# Patient Record
Sex: Male | Born: 1935 | Race: White | Hispanic: No | Smoking: Former smoker
Health system: Southern US, Community
[De-identification: ages and names within clinical notes are randomized; demographics above are authoritative.]

## PROBLEM LIST (undated history)

## (undated) DIAGNOSIS — E669 Obesity, unspecified: Secondary | ICD-10-CM

## (undated) DIAGNOSIS — I2510 Atherosclerotic heart disease of native coronary artery without angina pectoris: Secondary | ICD-10-CM

## (undated) DIAGNOSIS — I10 Essential (primary) hypertension: Secondary | ICD-10-CM

## (undated) DIAGNOSIS — E785 Hyperlipidemia, unspecified: Secondary | ICD-10-CM

## (undated) HISTORY — PX: OTHER SURGICAL HISTORY: SHX169

## (undated) HISTORY — PX: CHOLECYSTECTOMY: SHX55

## (undated) HISTORY — PX: CAROTID ARTERY ANGIOPLASTY: SHX1300

---

## 2012-03-21 ENCOUNTER — Ambulatory Visit: Payer: Self-pay | Admitting: Vascular Surgery

## 2012-04-17 ENCOUNTER — Ambulatory Visit: Payer: Self-pay | Admitting: Vascular Surgery

## 2012-04-17 LAB — CBC
MCH: 32.1 pg (ref 26.0–34.0)
MCHC: 34.1 g/dL (ref 32.0–36.0)
Platelet: 191 10*3/uL (ref 150–440)
RBC: 4.69 10*6/uL (ref 4.40–5.90)
WBC: 5.7 10*3/uL (ref 3.8–10.6)

## 2012-04-17 LAB — BASIC METABOLIC PANEL
Anion Gap: 9 (ref 7–16)
BUN: 16 mg/dL (ref 7–18)
Calcium, Total: 8.4 mg/dL — ABNORMAL LOW (ref 8.5–10.1)
Chloride: 108 mmol/L — ABNORMAL HIGH (ref 98–107)
Creatinine: 1 mg/dL (ref 0.60–1.30)
EGFR (African American): >60
EGFR (Non-African Amer.): >60
Glucose: 106 mg/dL — ABNORMAL HIGH (ref 65–99)
Osmolality: 287 (ref 275–301)
Potassium: 4.2 mmol/L (ref 3.5–5.1)
Sodium: 143 mmol/L (ref 136–145)

## 2012-04-24 ENCOUNTER — Inpatient Hospital Stay: Payer: Self-pay | Admitting: Vascular Surgery

## 2012-04-25 LAB — CBC WITH DIFFERENTIAL/PLATELET
Basophil #: 0 10*3/uL (ref 0.0–0.1)
Eosinophil #: 0 10*3/uL (ref 0.0–0.7)
HCT: 44.3 % (ref 40.0–52.0)
Lymphocyte %: 11.2 %
MCH: 32.2 pg (ref 26.0–34.0)
MCHC: 33.8 g/dL (ref 32.0–36.0)
MCV: 95 fL (ref 80–100)
Monocyte #: 0.6 x10 3/mm (ref 0.2–1.0)
Neutrophil %: 81.6 %
Platelet: 232 10*3/uL (ref 150–440)
RBC: 4.66 10*6/uL (ref 4.40–5.90)
RDW: 13.9 % (ref 11.5–14.5)
WBC: 8.4 10*3/uL (ref 3.8–10.6)

## 2012-04-25 LAB — PROTIME-INR: Prothrombin Time: 12.8 secs (ref 11.5–14.7)

## 2012-04-25 LAB — BASIC METABOLIC PANEL
Anion Gap: 8 (ref 7–16)
BUN: 11 mg/dL (ref 7–18)
Co2: 28 mmol/L (ref 21–32)
Creatinine: 0.85 mg/dL (ref 0.60–1.30)
EGFR (African American): >60
EGFR (Non-African Amer.): >60
Glucose: 108 mg/dL — ABNORMAL HIGH (ref 65–99)
Sodium: 140 mmol/L (ref 136–145)

## 2012-04-25 LAB — APTT: Activated PTT: 29 secs (ref 23.6–35.9)

## 2012-04-25 LAB — PATHOLOGY REPORT

## 2014-05-13 ENCOUNTER — Inpatient Hospital Stay: Payer: Self-pay | Admitting: Surgery

## 2014-05-13 LAB — URINALYSIS, COMPLETE
BACTERIA: NONE SEEN
BLOOD: NEGATIVE
Bilirubin,UR: NEGATIVE
GLUCOSE, UR: NEGATIVE mg/dL (ref 0–75)
Ketone: NEGATIVE
Leukocyte Esterase: NEGATIVE
NITRITE: NEGATIVE
Ph: 5 (ref 4.5–8.0)
RBC,UR: 1 /HPF (ref 0–5)
SPECIFIC GRAVITY: 1.027 (ref 1.003–1.030)
SQUAMOUS EPITHELIAL: NONE SEEN

## 2014-05-13 LAB — BASIC METABOLIC PANEL
ANION GAP: 6 — AB (ref 7–16)
BUN: 13 mg/dL (ref 7–18)
CHLORIDE: 104 mmol/L (ref 98–107)
CO2: 29 mmol/L (ref 21–32)
Calcium, Total: 8.3 mg/dL — ABNORMAL LOW (ref 8.5–10.1)
Creatinine: 0.97 mg/dL (ref 0.60–1.30)
EGFR (African American): >60
Glucose: 102 mg/dL — ABNORMAL HIGH (ref 65–99)
Osmolality: 278 (ref 275–301)
Potassium: 3.9 mmol/L (ref 3.5–5.1)
SODIUM: 139 mmol/L (ref 136–145)

## 2014-05-13 LAB — LIPASE, BLOOD: Lipase: 148 U/L (ref 73–393)

## 2014-05-13 LAB — CBC
HCT: 45.6 % (ref 40.0–52.0)
HGB: 14.9 g/dL (ref 13.0–18.0)
MCH: 32.2 pg (ref 26.0–34.0)
MCHC: 32.7 g/dL (ref 32.0–36.0)
MCV: 98 fL (ref 80–100)
Platelet: 207 10*3/uL (ref 150–440)
RBC: 4.64 10*6/uL (ref 4.40–5.90)
RDW: 12.4 % (ref 11.5–14.5)
WBC: 9.9 10*3/uL (ref 3.8–10.6)

## 2014-05-13 LAB — TROPONIN I: Troponin-I: <0.02 ng/mL

## 2014-05-14 LAB — COMPREHENSIVE METABOLIC PANEL
ALK PHOS: 148 U/L — AB
Albumin: 2.3 g/dL — ABNORMAL LOW (ref 3.4–5.0)
Anion Gap: 5 — ABNORMAL LOW (ref 7–16)
BILIRUBIN TOTAL: 1.1 mg/dL — AB (ref 0.2–1.0)
BUN: 12 mg/dL (ref 7–18)
CALCIUM: 7.7 mg/dL — AB (ref 8.5–10.1)
CO2: 30 mmol/L (ref 21–32)
Chloride: 101 mmol/L (ref 98–107)
Creatinine: 0.98 mg/dL (ref 0.60–1.30)
EGFR (African American): >60
EGFR (Non-African Amer.): >60
Glucose: 115 mg/dL — ABNORMAL HIGH (ref 65–99)
OSMOLALITY: 273 (ref 275–301)
Potassium: 4 mmol/L (ref 3.5–5.1)
SGOT(AST): 38 U/L — ABNORMAL HIGH (ref 15–37)
SGPT (ALT): 54 U/L
SODIUM: 136 mmol/L (ref 136–145)
TOTAL PROTEIN: 6.3 g/dL — AB (ref 6.4–8.2)

## 2014-05-14 LAB — CBC WITH DIFFERENTIAL/PLATELET
Basophil #: 0 10*3/uL (ref 0.0–0.1)
Basophil %: 0.2 %
EOS PCT: 0.5 %
Eosinophil #: 0 10*3/uL (ref 0.0–0.7)
HCT: 39.7 % — ABNORMAL LOW (ref 40.0–52.0)
HGB: 13.3 g/dL (ref 13.0–18.0)
LYMPHS ABS: 0.6 10*3/uL — AB (ref 1.0–3.6)
Lymphocyte %: 6.4 %
MCH: 32.9 pg (ref 26.0–34.0)
MCHC: 33.6 g/dL (ref 32.0–36.0)
MCV: 98 fL (ref 80–100)
Monocyte #: 0.9 x10 3/mm (ref 0.2–1.0)
Monocyte %: 10 %
NEUTROS PCT: 82.9 %
Neutrophil #: 7.3 10*3/uL — ABNORMAL HIGH (ref 1.4–6.5)
Platelet: 154 10*3/uL (ref 150–440)
RBC: 4.05 10*6/uL — ABNORMAL LOW (ref 4.40–5.90)
RDW: 12.3 % (ref 11.5–14.5)
WBC: 8.8 10*3/uL (ref 3.8–10.6)

## 2014-05-15 LAB — CBC WITH DIFFERENTIAL/PLATELET
BASOS ABS: 0 10*3/uL (ref 0.0–0.1)
Basophil %: 0.1 %
EOS ABS: 0 10*3/uL (ref 0.0–0.7)
Eosinophil %: 0.3 %
HCT: 37.7 % — AB (ref 40.0–52.0)
HGB: 12.8 g/dL — ABNORMAL LOW (ref 13.0–18.0)
Lymphocyte #: 0.5 10*3/uL — ABNORMAL LOW (ref 1.0–3.6)
Lymphocyte %: 7.2 %
MCH: 33.1 pg (ref 26.0–34.0)
MCHC: 34 g/dL (ref 32.0–36.0)
MCV: 97 fL (ref 80–100)
MONOS PCT: 11.3 %
Monocyte #: 0.8 x10 3/mm (ref 0.2–1.0)
Neutrophil #: 5.7 10*3/uL (ref 1.4–6.5)
Neutrophil %: 81.1 %
Platelet: 145 10*3/uL — ABNORMAL LOW (ref 150–440)
RBC: 3.88 10*6/uL — ABNORMAL LOW (ref 4.40–5.90)
RDW: 12.6 % (ref 11.5–14.5)
WBC: 7 10*3/uL (ref 3.8–10.6)

## 2014-05-15 LAB — BASIC METABOLIC PANEL
Anion Gap: 6 — ABNORMAL LOW (ref 7–16)
BUN: 15 mg/dL (ref 7–18)
CHLORIDE: 100 mmol/L (ref 98–107)
CO2: 29 mmol/L (ref 21–32)
CREATININE: 1.14 mg/dL (ref 0.60–1.30)
Calcium, Total: 7.7 mg/dL — ABNORMAL LOW (ref 8.5–10.1)
EGFR (African American): >60
EGFR (Non-African Amer.): >60
GLUCOSE: 114 mg/dL — AB (ref 65–99)
OSMOLALITY: 272 (ref 275–301)
POTASSIUM: 4.2 mmol/L (ref 3.5–5.1)
Sodium: 135 mmol/L — ABNORMAL LOW (ref 136–145)

## 2014-05-15 LAB — HEPATIC FUNCTION PANEL A (ARMC)
ALT: 37 U/L
AST: 24 U/L (ref 15–37)
Albumin: 2 g/dL — ABNORMAL LOW (ref 3.4–5.0)
Alkaline Phosphatase: 125 U/L — ABNORMAL HIGH
BILIRUBIN DIRECT: 0.4 mg/dL — AB (ref 0.00–0.20)
Bilirubin,Total: 0.8 mg/dL (ref 0.2–1.0)
Total Protein: 6 g/dL — ABNORMAL LOW (ref 6.4–8.2)

## 2014-05-16 LAB — CBC WITH DIFFERENTIAL/PLATELET
BASOS ABS: 0 10*3/uL (ref 0.0–0.1)
Basophil %: 0.2 %
Eosinophil #: 0.2 10*3/uL (ref 0.0–0.7)
Eosinophil %: 2.4 %
HCT: 35.5 % — AB (ref 40.0–52.0)
HGB: 11.9 g/dL — AB (ref 13.0–18.0)
LYMPHS PCT: 10.8 %
Lymphocyte #: 0.8 10*3/uL — ABNORMAL LOW (ref 1.0–3.6)
MCH: 32.7 pg (ref 26.0–34.0)
MCHC: 33.6 g/dL (ref 32.0–36.0)
MCV: 97 fL (ref 80–100)
MONO ABS: 0.9 x10 3/mm (ref 0.2–1.0)
Monocyte %: 12.9 %
NEUTROS PCT: 73.7 %
Neutrophil #: 5.2 10*3/uL (ref 1.4–6.5)
PLATELETS: 166 10*3/uL (ref 150–440)
RBC: 3.64 10*6/uL — AB (ref 4.40–5.90)
RDW: 12.6 % (ref 11.5–14.5)
WBC: 7.1 10*3/uL (ref 3.8–10.6)

## 2014-05-16 LAB — BASIC METABOLIC PANEL
ANION GAP: 5 — AB (ref 7–16)
BUN: 10 mg/dL (ref 7–18)
CALCIUM: 8 mg/dL — AB (ref 8.5–10.1)
Chloride: 103 mmol/L (ref 98–107)
Co2: 30 mmol/L (ref 21–32)
Creatinine: 0.9 mg/dL (ref 0.60–1.30)
EGFR (African American): >60
EGFR (Non-African Amer.): >60
Glucose: 106 mg/dL — ABNORMAL HIGH (ref 65–99)
Osmolality: 275 (ref 275–301)
Potassium: 4.2 mmol/L (ref 3.5–5.1)
SODIUM: 138 mmol/L (ref 136–145)

## 2014-05-17 LAB — CBC WITH DIFFERENTIAL/PLATELET
BASOS ABS: 0 10*3/uL (ref 0.0–0.1)
Basophil %: 0.5 %
Eosinophil #: 0.3 10*3/uL (ref 0.0–0.7)
Eosinophil %: 3.2 %
HCT: 37.8 % — ABNORMAL LOW (ref 40.0–52.0)
HGB: 12.7 g/dL — ABNORMAL LOW (ref 13.0–18.0)
LYMPHS PCT: 9.4 %
Lymphocyte #: 0.8 10*3/uL — ABNORMAL LOW (ref 1.0–3.6)
MCH: 32.6 pg (ref 26.0–34.0)
MCHC: 33.7 g/dL (ref 32.0–36.0)
MCV: 97 fL (ref 80–100)
Monocyte #: 1.1 x10 3/mm — ABNORMAL HIGH (ref 0.2–1.0)
Monocyte %: 13.3 %
NEUTROS ABS: 6 10*3/uL (ref 1.4–6.5)
NEUTROS PCT: 73.6 %
Platelet: 203 10*3/uL (ref 150–440)
RBC: 3.91 10*6/uL — ABNORMAL LOW (ref 4.40–5.90)
RDW: 12.3 % (ref 11.5–14.5)
WBC: 8.2 10*3/uL (ref 3.8–10.6)

## 2014-05-17 LAB — BASIC METABOLIC PANEL
Anion Gap: 7 (ref 7–16)
BUN: 7 mg/dL (ref 7–18)
CALCIUM: 7.9 mg/dL — AB (ref 8.5–10.1)
Chloride: 102 mmol/L (ref 98–107)
Co2: 32 mmol/L (ref 21–32)
Creatinine: 0.94 mg/dL (ref 0.60–1.30)
EGFR (African American): >60
Glucose: 118 mg/dL — ABNORMAL HIGH (ref 65–99)
Osmolality: 280 (ref 275–301)
POTASSIUM: 4.1 mmol/L (ref 3.5–5.1)
Sodium: 141 mmol/L (ref 136–145)

## 2014-05-17 LAB — COMPREHENSIVE METABOLIC PANEL
AST: 21 U/L (ref 15–37)
Albumin: 1.9 g/dL — ABNORMAL LOW (ref 3.4–5.0)
Alkaline Phosphatase: 114 U/L
Bilirubin,Total: 0.3 mg/dL (ref 0.2–1.0)
SGPT (ALT): 24 U/L
Total Protein: 6 g/dL — ABNORMAL LOW (ref 6.4–8.2)

## 2014-05-18 LAB — PATHOLOGY REPORT

## 2014-11-17 NOTE — Op Note (Signed)
PATIENT NAME:  Aaron Wilkinson, Aaron Wilkinson MR#:  [id number] DATE OF BIRTH:  08-29-1929  DATE OF PROCEDURE:  04/24/2012  PREOPERATIVE DIAGNOSES:  1. High-grade right carotid artery stenosis.  2. Mild to moderate left carotid artery stenosis.  3. Hypertension.  4. Hyperlipidemia.  5. Coronary disease.   POSTOPERATIVE DIAGNOSES: 1. High-grade right carotid artery stenosis.  2. Mild to moderate left carotid artery stenosis.  3. Hypertension.  4. Hyperlipidemia.  5. Coronary disease.   PROCEDURE: Right carotid endarterectomy with CorMatrix patch arterial repair.   SURGEON: Annice Needy, MD   ANESTHESIA: General.   ESTIMATED BLOOD LOSS: Approximately 50 mL.   INDICATION FOR PROCEDURE: This is an 81 year old male with high-grade right carotid artery stenosis in the 90% range by CT angiography. His left carotid artery stenosis had a mild to moderate blockage. For stroke risk reduction reasons, endarterectomy was discussed and he desired to proceed.   DESCRIPTION OF PROCEDURE: The patient is brought to the operative suite and after an adequate level of general anesthesia was obtained he was placed in the modified beach chair position. His neck was flexed and turned to the side. A roll was placed under his shoulders to help facilitate our exposure. He was sterilely prepped and draped and a sterile surgical field was created. An incision was created along the anterior border of the sternocleidomastoid. We dissected through the platysma with electrocautery. I then retracted the sternocleidomastoid laterally. There were some small venous branches that were ligated and divided between silk ties as was the facial vein. This exposed the carotid bifurcation. The artery was small to medium. This was dissected out and the common carotid artery, external carotid artery, internal carotid artery, and superior thyroid artery were all encircled with vessel loops. 6000 units of intravenous heparin was given for systemic  anticoagulation and control was pulled up on the vessel loops. Anterior arteriotomy was made with an 11 blade and extended with Potts scissors. The Pruitt-Inahara shunt was then placed first in the internal carotid artery, flushed and de-aired, and then the common carotid artery flushed and de-aired, and then flow was restored. Approximate time from clamp to flow restoration was two minutes. At this point endarterectomy was performed in the typical fashion with the elevator. The proximal endpoint was cut flush with tenotomy scissors. An eversion endarterectomy was performed in the external carotid artery and a nice feathered distal endpoint was created with gentle traction in the internal carotid artery. The distal endpoint was tacked down with two 7-0 Prolene sutures. The wound was locally heparinized. Due to the small size of the artery, a patch closure was necessary. A CorMatrix extracellular Matrix patch was selected for the arterial repair. This was cut and beveled and a 6-0 Prolene suture was started at the distal endpoint and run approximately one-half the length of the arteriotomy medially and laterally. The patch was cut and beveled to an appropriate length proximally to match the arteriotomy and a second 6-0 Prolene was started at the proximal endpoint. The medial suture lines were tied together and the lateral suture lines were run until the shunt needed to be removed. The shunt was then removed, the vessel was flushed, and the external, internal, and common carotid arteries were then locally heparinized and then the arteriotomy was completed flushing through the external carotid artery prior to release of control. Four minutes elapsed from shunt removal until flow restoration to the internal carotid artery. There were a couple of areas that required 6-0 Prolene patch sutures and  hemostasis was achieved. The wound was then irrigated. Evicyl was placed and the wound was closed with three interrupted 3-0  Vicryl, the platysma was closed with a running 3-0 Vicryl, and the skin was closed with 4-0 Monocryl. Sterile dressing was placed. The patient tolerated the procedure well and was taken to the recovery room in stable condition.   ____________________________ Annice NeedyJason S. Dew, MD jsd:drc D: 04/24/2012 13:22:36 ET T: 04/24/2012 13:40:04 ET JOB#: [id number]  cc: Annice NeedyJason S. Dew, MD, <Dictator> Marya AmslerMarshall W. [name], MD Annice NeedyJASON S DEW MD ELECTRONICALLY SIGNED 04/25/2012 13:44

## 2014-11-21 NOTE — Op Note (Signed)
PATIENT NAME:  Aaron Wilkinson, Aaron Wilkinson MR#:  [id number] DATE OF BIRTH:  [birth year]  DATE OF PROCEDURE:  05/14/2014  PREOPERATIVE DIAGNOSIS: Acute cholecystitis.   POSTOPERATIVE DIAGNOSIS: Acute gangrenous cholecystitis with empyema.  PROCEDURE: Laparoscopic cholecystectomy.   SURGEON: Dr. Dionne Miloichard Christpher Stogsdill.   ANESTHESIA: General with endotracheal tube.   ASSISTANT: Docia ChuckKirk Williams, PA-S  INDICATIONS: This is a patient with right upper quadrant pain and a workup showing acute cholecystitis. Preoperatively, we discussed the rationale for surgery, the options of observation, risk of bleeding, infection, recurrence of symptoms, failure to resolve his symptoms, open procedure, bile duct damage, bile duct leak, bowel injury, retained common bile duct stone, any of which could require further surgery and/or ERCP, stent, and papillotomy. This was all reviewed for him and his family. They understood and agreed to proceed.   FINDINGS: Acute gangrenous cholecystitis with empyema, necrotic gallbladder with fragmentation, small stones.   Of note, the back wall of the gallbladder, which was necrotic was left in place and fulgurated.   DESCRIPTION OF PROCEDURE: The patient was induced to general anesthesia. He was on IV Levaquin, was redosed during the case. VTE prophylaxis was in place. He was prepped and draped in a sterile fashion. Marcaine was infiltrated in skin and subcutaneous tissues around the periumbilical area. An incision was made. Veress needle was placed. Pneumoperitoneum was obtained and a 5-mm trocar port was placed. The abdominal cavity was explored and under direct vision, a 10-mm epigastric port and 2 lateral 5-mm ports were placed. The gallbladder was found to be encased in omentum and was taken down bluntly from the fundus down revealing a greatly inflamed gallbladder which essentially fell apart as it was being dissected revealing small black gallstones and frank purulence and necrosis of the  gallbladder wall. A portion of the gallbladder was placed on tension. The abdominal viscera, either the transverse colon or the duodenum, was greatly adherent to the infundibular area of the gallbladder. This was taken down with blunt dissection very carefully to avoid injury to the bowel. None was identified.  This may have signified an impending or early choledochocolonic or choledochoduodenal fistula although no connection was identified at the time of the operation.   Further dissection revealed the cystic duct which was dissected out and doubly clipped and divided. The cystic artery was identified and doubly clipped and divided, but it was thrombosed and did not back bleed. The gallbladder was taken in pieces from the gallbladder back wall which was necrotic as well and could not be easily dissected off of the gallbladder fossa. The portions of gallbladder and some of the stones were retrieved with an Endo Catch bag. The area was irrigated with copious amounts of normal saline. Hemostasis was with electrocautery. With the back wall still being in place and being necrotic, it was fulgurated with electrocautery and checked for hemostasis multiple times.  From the lateral port site was placed a 10-mm JP drain, placed into the foramen of Winslow tied in with 3-0 nylon. Again, further inspection demonstrated minimal oozing in the area of the cystic artery, which was believed to be thrombosed, but there was no arterial bleeding. Therefore, 2 pieces of Surgicel were placed in this area. No further bleeding was noted. It was inspected over time and irrigation was performed. Again no further bleeding noted.  (To aid in this view, the 30-degree 5-mm scope was utilized as well.)   The patient tolerated this procedure well. There were no complications. Considering the extent of necrosis and frank  purulence within the gallbladder, the laparoscopic approach was attained and he was taken to the recovery room in  stable condition to be admitted for continued care. After closing his wounds with 0 Vicryl figure-of-8 sutures at the epigastric site and staples at the skin edges. The drain was placed to bulb suction and sterile dressings were placed.   The patient tolerated the procedure well.    ____________________________ Adah Salvage. Excell Seltzer, MD rec:lt D: 05/14/2014 13:55:04 ET T: 05/14/2014 14:26:16 ET JOB#: [id number]  cc: Adah Salvage. Excell Seltzer, MD, <Dictator> Lattie Haw MD ELECTRONICALLY SIGNED 05/14/2014 17:37

## 2014-11-21 NOTE — Consult Note (Signed)
PATIENT NAME:  Aaron Wilkinson, Aaron Wilkinson MR#:  [id number] DATE OF BIRTH:  1930-06-28  DATE OF CONSULTATION:  05/13/2014  REFERRING PHYSICIAN:  Adah Salvageichard E. Excell Seltzerooper, MD.  CARDIOLOGIST: Darlin PriestlyKenneth A. [name], MD.  PRIMARY CARE PHYSICIAN:  Marcina MillardAlexander Graclynn Vanantwerp, MD.  CHIEF COMPLAINT: Abdominal pain.   HISTORY OF PRESENT ILLNESS: The patient is an 81 year old gentleman who presents with abdominal pain, seen by Dr. Excell Seltzerooper and found to have acute cholecystitis. The patient has known coronary artery disease, status post CABG. He was then lost to follow-up for 2 years. The patient denies prior history of myocardial infarction, congestive heart failure, chronic kidney disease, diabetes or stroke. He denies chest pain or shortness of breath.   PAST MEDICAL HISTORY: 1. Coronary artery bypass graft surgery in 1994.  2. Hyperlipidemia.  3. Status post right carotid endarterectomy.   MEDICATIONS: Alprazolam 25 mg b.i.d.   SOCIAL HISTORY: The patient currently lives alone. The patient has a remote tobacco abuse history.   FAMILY HISTORY: No immediate family history of coronary artery disease or myocardial infarction.   REVIEW OF SYSTEMS:  CONSTITUTIONAL: No fever or chills.  EYES: No blurry vision.  EARS: No hearing loss.  RESPIRATORY: No shortness of breath.  CARDIOVASCULAR: No chest pain.  GASTROINTESTINAL: The patient had abdominal pain as described above.  GENITOURINARY: No dysuria or hematuria.  ENDOCRINE: No polyuria or polydipsia.  MUSCULOSKELETAL: No arthralgias or myalgias.  NEUROLOGIC: No focal muscle weakness or numbness.  PSYCHOLOGICAL: No depression or anxiety.   PHYSICAL EXAMINATION: VITAL SIGNS: Blood pressure 124/73, pulse 87, respirations 20, temperature 99, pulse oximetry 95%.  HEENT: Pupils equal, reactive to light and accommodation.  NECK: Supple without thyromegaly.  LUNGS: Clear.  HEART: Normal JVP. Normal PMI. Regular rate and rhythm. Normal S1, S2. No appreciable gallop, murmur or  rub.  ABDOMEN: Soft and nontender. Pulses were intact bilaterally.  MUSCULOSKELETAL: Normal muscle tone.  NEUROLOGIC: The patient is alert and oriented x 3. Motor and sensory both grossly intact.   IMPRESSION: An 81 year old gentleman with known coronary artery disease, status post coronary artery bypass graft, currently without chest pain. The patient presents with abdominal pain with acute cholecystitis awaiting laparoscopic cholecystectomy. The patient has had no prior history of myocardial infarction, congestive heart failure, stroke, diabetes or chronic kidney disease. EKG is unremarkable. The patient would be at low risk for serious cardiovascular complication during laparoscopic cholecystectomy.   RECOMMENDATIONS: 1. Agree with overall current therapy.  2. Restart low-dose beta blocker for cardioprotection.  3. Proceed with surgery as planned without further cardiac diagnostics at this time.   ____________________________ Marcina MillardAlexander Katanya Schlie, MD ap:TT D: 05/13/2014 16:55:39 ET T: 05/13/2014 18:10:25 ET JOB#: [id number]  cc: Marcina MillardAlexander Salaya Holtrop, MD, <Dictator> Marcina MillardALEXANDER Panagiotis Oelkers MD ELECTRONICALLY SIGNED 05/16/2014 14:13

## 2014-11-21 NOTE — Discharge Summary (Signed)
PATIENT NAME:  Aaron Wilkinson, Aaron Wilkinson MR#:  [id number] DATE OF BIRTH:  [birth year]  DATE OF ADMISSION:  05/13/2014 DATE OF DISCHARGE:  05/17/2014  DIAGNOSES:  1. Acute gangrenous cholecystitis with empyema of the gallbladder.  2. Coronary artery disease.  3. Peripheral vascular disease.  4. Stroke.   PROCEDURES: Laparoscopic cholecystectomy.   CONSULTANTS: Dr. Einar CrowMarshall Anderson, internal medicine and Dr. [name], cardiology.   HISTORY OF PRESENT ILLNESS AND HOSPITAL COURSE: This is a patient who presents with several days of abdominal pain. He stated it was 5 days. In retrospect, his son states he has been ill for several months. The patient is minimizing his symptoms, but he also is noncompliant with his medications and physician follow up. In fact, he had coronary artery bypass graft in 1994. He did not see a cardiologist until 2 years ago, when he was required to do so for preoperative clearance for a carotid endarterectomy performed by Dr. [name]. Since that time 2 years ago, he has not seen a cardiologist and has not taken any of his medications. No anticholesterol drugs and no presumed beta blocker because his medications make him feel strange. He has been noncompliant with his followup and medications.   The patient was taken to the operating room for acute cholecystitis, at which time it was identified that he had gangrenous cholecystitis with empyema of the gallbladder. A drain was placed. Postoperatively, the drain showed no sign of biliary fistula or purulence. Therefore, the drain was removed once he was tolerating a regular diet. He is currently doing well and has been reminded, both he and his family, of the need for compliance with his medication recommendations and prescriptions as well as follow up with both Dr. [name] either Dr. [name] or Dr. [name]. Dr. [name] is who he had seen previously.   The patient understands, as does his family, the importance of this follow up and the  medications. The family is skeptical that he will maintain compliance with these, however. The patient is discharged on a regular diet with staples in place with instructions to shower tomorrow. His medications are reconciled. His only new medication then would be the oral analgesic, but he is also on an anticholesterol medication and metoprolol.     ____________________________ Adah Salvageichard E. Excell Seltzerooper, MD rec:TT D: 05/17/2014 08:30:49 ET T: 05/17/2014 18:15:40 ET JOB#: [id number]  cc: Adah Salvageichard E. Excell Seltzerooper, MD, <Dictator> Lattie HawICHARD E Hymen Arnett MD ELECTRONICALLY SIGNED 05/17/2014 18:59

## 2014-11-21 NOTE — H&P (Signed)
PATIENT NAME:  Aaron Wilkinson, Aaron Wilkinson MR#:  [id number] DATE OF BIRTH:  [birth year]  DATE OF ADMISSION:  05/13/2014   CHIEF COMPLAINT: Epigastric pain.   HISTORY OF PRESENT ILLNESS: This is a patient with 5 days of abdominal pain. It has not been related to any kind of meal, but on Friday he noted abdominal pain. He points to the epigastrium. He states that it has moved to the right side and right upper quadrant, as well as the right lower quadrant and right back. He has not had an episode like this before. He denies fevers or chills. He has had some nausea, multiple emeses on Friday or Saturday, and then again this morning. He has not eaten since yesterday. He denies jaundice or acholic stools. He has had some loose stools and occasional diarrhea over the weekend.   PAST MEDICAL HISTORY: Coronary artery disease, coronary artery bypass graft several years ago, right carotid endarterectomy. Of note, the patient has not seen his cardiologist in 2 years, when he saw a cardiologist, Dr. Lady Gary, for cardiac clearance for his carotid endarterectomy. 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 His CABG was in the 1990s, and he states he has not seen a cardiologist between his bypass and Dr. Driscilla Grammes need for cardiac clearance. He also states that he is essentially noncompliant, in that he does not take his anticholesterol medication nor his presumed beta blocker because it makes him feel bad. He is not taking any medications at this time, except occasional Xanax.   PAST SURGICAL HISTORY: Coronary artery bypass graft and left inguinal hernia repair.   ALLERGIES: PENICILLIN AND SHELLFISH.   MEDICATIONS: Xanax.   FAMILY HISTORY: Noncontributory.   SOCIAL HISTORY: The patient used to smoke tobacco; he stopped many years ago, with approximately a 5 pack-year history. He drinks alcohol daily, he states 2 beers with 1 [employer]  (whiskey). He lives alone, but he is accompanied by his son and daughter. He is a retired Biomedical scientist.   REVIEW OF SYSTEMS: A 10 system review was performed and negative, with the exception of that mentioned in the HPI and documented in the chart.   PHYSICAL EXAMINATION:  GENERAL: Healthy, comfortable-appearing male patient. He is afebrile at 97.6, pulse of 82, respirations 16, blood pressure 121/68. Pain scale 3. Room air saturation 98%.  HEENT: No scleral icterus.  NECK: No palpable neck nodes.  CHEST: Clear to auscultation.  CARDIAC: Regular rate and rhythm.  ABDOMEN: Showing some guarding in the right upper quadrant, tenderness in the epigastrium and right upper quadrant with a positive Murphy sign.  EXTREMITIES: Without edema.  NEUROLOGIC: Grossly intact. INTEGUMENT: No jaundice. There is a midline sternotomy scar with drain scars, all well healed.  DIAGNOSTIC DATA: CT scan and ultrasound are reviewed, suggestive of acute appendicitis. Bile duct is 4 mm. Stones are noted.   No liver function tests were drawn in the Emergency Room, and are not currently available.   Creatinine is 0.97; electrolytes are otherwise within normal limits.   White blood cell count of 9.9, hemoglobin and hematocrit of 14.9 and 45.6 with a platelet count of 207,000.   ASSESSMENT AND PLAN: This is a patient with acute cholecystitis. He is relatively noncompliant with his medications for coronary artery disease and prior carotid endarterectomy. He takes no anticoagulation. He takes no beta blocker or anticholesterol drugs; although they apparently have been prescribed, he  refuses to take them because it makes him feel bad. He has not seen a cardiologist in 2 years, and then only because he needed clearance for his carotid endarterectomy. There was a question of whether or not he has ocular stroke prior to his carotid endarterectomy.   With the diagnosis of acute cholecystitis, he will require a laparoscopic  cholecystectomy at some point. However, I would like to have him be seen by Dr. [name] if possible, and by Dr. Einar CrowMarshall Anderson, his internist, for optimization of any medications at this point, and then proceeding to laparoscopic cholecystectomy. I will place consults.   I have discussed this patient with Dr. Rochel BromeBecky Lord in the Emergency Room, and with his family.     ____________________________ Adah Salvageichard E. Excell Seltzerooper, MD rec:MT D: 05/13/2014 15:19:06 ET T: 05/13/2014 15:42:05 ET JOB#: [id number]  cc: Adah Salvageichard E. Excell Seltzerooper, MD, <Dictator> Lattie HawICHARD E Shatha Hooser MD ELECTRONICALLY SIGNED 05/14/2014 10:41

## 2014-11-21 NOTE — Consult Note (Signed)
PATIENT NAME:  Aaron Wilkinson, CRUCES MR#:  [id number] DATE OF BIRTH:  [birth year]  DATE OF CONSULTATION:  05/13/2014  PRIMARY CARE PHYSICIAN: Marya Amsler. Dareen Piano, MD.   REQUESTING PHYSICIAN: Adah Salvage. Excell Seltzer, MD.   REASON FOR CONSULTATION: Hypertension, CAD and perioperative management in a patient with cholecystitis.   HISTORY OF PRESENTING ILLNESS: An 81 year old, Caucasian male patient presented to the emergency room complaining of epigastric abdominal pain along with nausea and vomiting. The patient has been found to have acute cholecystitis on CT scan followed by an ultrasound. Presently, plan is for a cholecystectomy tomorrow. The patient mentions that he has had similar issues in the past which tend to resolve with some Aleve, ibuprofen or BC powder, but this time they were much more severe. He does not have any vomiting at this point. Does have nausea. Has some mild pain in the periumbilical epigastric area and right upper quadrant area. He does not complain of any chest pain, shortness of breath, syncope, dizziness. He has not been taking all his home medications. The patient was supposed to be on blood pressure and cholesterol medications, but has not been taking them as these cause nausea.   PAST MEDICAL HISTORY:  1. CAD with CABG 20 years back.  2. Hypertension.  3. Hyperlipidemia.  4. Obesity.  5. Right carotid stenosis status post endarterectomy in 2013.  6. History of esophagitis and hiatal hernia.   SOCIAL HISTORY: The patient does not smoke. Rare alcohol use. No illicit drug use. 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   CODE STATUS: Full code.   FAMILY HISTORY: Hypertension.   ALLERGIES: PENICILLIN AND SHELLFISH.   HOME MEDICATIONS: Are alprazolam 0.25 mg oral daily as needed, which he takes rarely.   REVIEW OF SYSTEMS:  CONSTITUTIONAL:  Complains of some fatigue.  EYES: No blurred vision, pain or redness.  ENT: No tinnitus, ear pain, hearing loss.   RESPIRATORY: No cough, wheeze, hemoptysis.  CARDIOVASCULAR: No chest pain, orthopnea, edema.  GASTROINTESTINAL: Has nausea. Vomiting has resolved. Also has abdominal pain, but no diarrhea.  GENITOURINARY: No dysuria, hematuria, frequency.  ENDOCRINE: No polyuria, nocturia, thyroid problems.  HEMATOLOGIC AND LYMPHATIC: No anemia, easy bruising, bleeding. INTEGUMENTARY: No acne, rash, lesion.  MUSCULOSKELETAL: No back pain or arthralgias. NEUROLOGIC: No focal numbness, weakness, seizure.  PSYCHIATRIC: No anxiety or depression.   PHYSICAL EXAMINATION: VITAL SIGNS: Temperature of 99, pulse 87, respirations 20, blood pressure 124/73, saturating 95% on room air.  GENERAL: Obese, Caucasian, male patient lying in bed. Overall seems comfortable, conversational, cooperative with exam.  PSYCHIATRIC: Alert and oriented x 3. Mood and affect appropriate. Judgment intact.  HEENT: Atraumatic, normocephalic. Oral mucosa dry and pink. External ears and nose normal. No pallor. No icterus. Pupils bilaterally equal and reactive to light.  NECK: Supple. No thyromegaly. No palpable lymph nodes. Trachea midline. No JVD.  CARDIOVASCULAR: S1, S2 without any murmurs. Peripheral pulses 2+. No edema.  RESPIRATORY: Normal work of breathing. Clear to auscultation on both sides.  GASTROINTESTINAL: Soft abdomen. Tenderness in the epigastric and right upper quadrant area on deep palpation. Murphy's sign negative.  GENITOURINARY: No CVA tenderness or bladder distention.  SKIN: Warm and dry. No petechiae, rash, ulcers.  MUSCULOSKELETAL: No joint swelling, redness, effusion of the large joints. Normal muscle tone.  NEUROLOGICAL: Motor strength 5/5 in upper and lower extremities. Sensation is intact all over.  LYMPHATICS: No cervical lymphadenopathy.   LABORATORY DATA: WBC 9.9, hemoglobin 14.9, platelets of 207,000. Urinalysis shows no bacteria. Troponin less  than 0.02. Glucose 102. BUN 13, creatinine 0.97, sodium 139,  potassium 3.9, GFR 160.   DIAGNOSTIC DATA:  1. EKG shows normal sinus rhythm with nonspecific changes.  2. Chest x-ray shows pulmonary hyperinflation, nothing acute.  3. CT scan of the abdomen and pelvis with contrast shows acute cholecystitis with gallbladder wall thickening, extensive inflammatory change around the gallbladder and diverticulosis.  4. Ultrasound of right upper quadrant confirmed acute cholecystitis.   ASSESSMENT AND PLAN: 1. Coronary artery disease. The patient does not have any chest pain, is asymptomatic at this point and seems to have good functional status. The patient has been started on beta blocker by Dr. [name] at 50 mg which will be continued. I have explained to the patient that this is a small dose and should not cause any side effects. We will monitor. The patient should also be taking an aspirin after discharge daily.  2. The patient should be low risk for his cholecystectomy procedure.  3. Acute cholecystitis. Continue antibiotics. Surgery has been scheduled tomorrow. Continue intravenous fluids.  4. Hyperlipidemia. We will start the patient on a small dose of Lipitor.  5. Deep vein thrombosis prophylaxis. The patient is on heparin.   TIME SPENT: Today on this consult was 35 minutes.     ____________________________ Molinda BailiffSrikar R. Arty Lantzy, MD srs:TT D: 05/13/2014 17:53:34 ET T: 05/13/2014 18:55:12 ET JOB#: [id number]  cc: Wardell HeathSrikar R. Jaelle Campanile, MD, <Dictator> Orie FishermanSRIKAR R Brynja Marker MD ELECTRONICALLY SIGNED 05/27/2014 10:45

## 2014-11-21 NOTE — Consult Note (Signed)
Brief Consult Note: Diagnosis: POC, known CAD s/p CABG, no CP, probable low risk for serious CV complication during lap chole.   Patient was seen by consultant.   Consult note dictated.   Comments: 1.  Start metoprolol succinate 50 mg QD 2.  Defer cardiac daiagnostics 3.  Proceed with lap chole.  Electronic Signatures: Marcina MillardParaschos, Demetrus Pavao (MD)  (Signed 14-Oct-15 16:57)  Authored: Brief Consult Note   Last Updated: 14-Oct-15 16:57 by Marcina MillardParaschos, Novelle Addair (MD)

## 2014-11-21 NOTE — H&P (Signed)
Subjective/Chief Complaint epig pain   History of Present Illness five days worsening epig and RUQ pain nausea, mult emesis no f/c no jaundice no ach stools, occ diarrhea   Past History CAD/CABG, rt CEA has not seen cardiologist in 2 years, does not take his prescribed anti cholest meds or presumed beta blocker. Had seen Dr Ubaldo Glassing 2013 for cardiac clearance for Dr Bunnie Domino CE[facility]. Hx CVA? ocular TIA?  [facility] CABG, [facility]   Past Medical Health Coronary Artery Disease   Past Med/Surgical Hx:  Chronic Shortness of Breath:   HOH:   CAD:   Hernia Surgery:   CABG:   ALLERGIES:  Penicillin: Swelling  Shellfish: Hives  Family and Social History:  Family History Non-Contributory   Social History positive  tobacco, positive ETOH, retired Midwife; stopped tobacco many years ago   + Tobacco Prior (greater than 1 year)   Place of Living Home   Review of Systems:  Fever/Chills No   Cough No   Abdominal Pain Yes   Diarrhea Yes   Constipation No   Nausea/Vomiting Yes   SOB/DOE No   Chest Pain No   Dysuria No   Tolerating Diet No  Nauseated  Vomiting   Medications/Allergies Reviewed Medications/Allergies reviewed   Physical Exam:  GEN no acute distress   HEENT pink conjunctivae   NECK supple   RESP normal resp effort  clear BS  no use of accessory muscles   CARD regular rate   ABD positive tenderness  guarding RUQ, pos Murphy's sign   LYMPH rt CEA scar   EXTR negative edema   SKIN normal to palpation   PSYCH alert, A+O to time, place, person, poor insight, minimizes condition, noncompliant   Lab Results: Routine Chem:  14-Oct-15 03:09   Lipase 148 (Result(s) reported on 13 May 2014 at 08:09AM.)  Glucose, Serum  102  BUN 13  Creatinine (comp) 0.97  Sodium, Serum 139  Potassium, Serum 3.9  Chloride, Serum 104  CO2, Serum 29  Calcium (Total), Serum  8.3  Anion Gap  6  Osmolality (calc) 278  eGFR (African American) >60  eGFR (Non-African American)  >60 (eGFR values <33m/min/1.73 m2 may be an indication of chronic kidney disease (CKD). Calculated eGFR, using the MRDR Study equation, is useful in  patients with stable renal function. The eGFR calculation will not be reliable in acutely ill patients when serum creatinine is changing rapidly. It is not useful in patients on dialysis. The eGFR calculation may not be applicable to patients at the low and high extremes of body sizes, pregnant women, and vetetarians.)  Cardiac:  14-Oct-15 03:09   Troponin I < 0.02 (0.00-0.05 0.05 ng/mL or less: NEGATIVE  Repeat testing in 3-6 hrs  if clinically indicated. >0.05 ng/mL: POTENTIAL  MYOCARDIAL INJURY. Repeat  testing in 3-6 hrs if  clinically indicated. NOTE: An increase or decrease  of 30% or more on serial  testing suggests a  clinically important change)  Routine UA:  14-Oct-15 05:41   Color (UA) Amber  Clarity (UA) Clear  Glucose (UA) Negative  Bilirubin (UA) Negative  Ketones (UA) Negative  Specific Gravity (UA) 1.027  Blood (UA) Negative  pH (UA) 5.0  Protein (UA) 30 mg/dL  Nitrite (UA) Negative  Leukocyte Esterase (UA) Negative (Result(s) reported on 13 May 2014 at 07:11AM.)  RBC (UA) 1 /HPF  WBC (UA) 1 /HPF  Bacteria (UA) NONE SEEN  Epithelial Cells (UA) NONE SEEN  Mucous (UA) PRESENT (Result(s) reported on 13 May 2014  at 07:11AM.)  Routine Hem:  14-Oct-15 03:09   WBC (CBC) 9.9  RBC (CBC) 4.64  Hemoglobin (CBC) 14.9  Hematocrit (CBC) 45.6  Platelet Count (CBC) 207 (Result(s) reported on 13 May 2014 at 03:24AM.)  MCV 98  MCH 32.2  MCHC 32.7  RDW 12.4   Radiology Results: XRay:    14-Oct-15 03:00, Chest PA and Lateral  Chest PA and Lateral  REASON FOR EXAM:    CP  COMMENTS:       PROCEDURE: DXR - DXR CHEST PA (OR AP) AND LATERAL  - May 13 2014  3:00AM     CLINICAL DATA:  Right-sided chest pain since Friday. Initial  encounter.    EXAM:  CHEST  2 VIEW    COMPARISON:  None.    FINDINGS:  No  cardiomegaly. Aortic tortuosity. Status post CABG. Pulmonary  hyperinflation. There is no edema, consolidation, effusion, or  pneumothorax.    Subtle, smudgy density overlapping the anterior left sixth rib is  not convincing for pulmonary nodule orsclerotic bone focus. No  acute osseous findings.     IMPRESSION:  1. No acute findings.  2. Pulmonary hyperinflation.      Electronically Signed    By: Jorje Guild M.D.    On: 05/13/2014 03:53     Verified By: Gilford Silvius, M.D.,  [place]:    14-Oct-15 09:00, US Abdomen Limited Survey  US Abdomen Limited Survey  REASON FOR EXAM:    ABD PAIN  COMMENTS:   Body Site: Gallbladder, Liver, Common Bile Duct    PROCEDURE: [place]  - US ABDOMEN LIMITED SURVEY  - May 13 2014  9:00AM     CLINICAL DATA:  Abdominal pain.  Abnormal CT scan this same day.    EXAM:  US ABDOMEN LIMITED - RIGHT UPPER QUADRANT    COMPARISON:  CT abdomen and pelvis 05/03/2014 at 6:48 a.m.    FINDINGS:  Gallbladder:  Multiple small stones are identified in the gallbladder measuring up  to 0.7 cm in diameter. Gallbladder sludge is also seen. Thereare  areas of gallbladder wall thickening of up to 0.5 cm. The  sonographer was unable to evaluate for Murphy's sign as the patient  has received pain medication.    Common bile duct:    Diameter: 0.4 cm.    Liver:    0.7 cm simple cyst in left lobe is noted. Within normal limits in  parenchymal echogenicity.   IMPRESSION:  When correlated with CT scan today, findings are most consistent  with acute cholecystitis.      Electronically Signed    By: Inge Rise M.D.    On: 05/13/2014 09:21         Verified By: Ramond Dial, M.D.,  CT:    14-Oct-15 06:52, CT Abdomen and Pelvis With Contrast  CT Abdomen and Pelvis With Contrast  REASON FOR EXAM:    (1) generalized abdominal pain and vomiting; (2)   generalized abdominal pain and  COMMENTS:       PROCEDURE: CT  - CT ABDOMEN / PELVIS  W  - May 13 2014  6:52AM     CLINICAL DATA:  Abdominal pain and vomiting. Midsternal chest pain  radiating to the back. Symptoms since 05/08/2014. Initial encounter.    EXAM:  CT ABDOMEN AND PELVIS WITH CONTRAST    TECHNIQUE:  Multidetector CT imaging of the abdomen and pelvis was performed  using the standard protocol following bolus administration of  intravenous contrast.  CONTRAST:  100 cc Isovue 300.    COMPARISON:  None.    FINDINGS:  The lung bases are clear.  No pleural or pericardial effusion.    The gallbladder is dilated with small stones present. The  gallbladder wall is thickened and there is stranding about the  gallbladder. The biliary tree is unremarkable.    The liver, spleen, adrenal glands, pancreas and right kidney appear  normal. A small left renal cyst is incidentally noted. Aortoiliac  atherosclerosis without aneurysm is seen. The prostate gland is  mildly enlarged.    Diverticulosis is most notable in the sigmoid colon but there is no  evidence of diverticulitis. The colon is otherwise unremarkable. The  stomach, small bowel and appendix appear normal. Nofocal fluid  collection is identified. There is no lymphadenopathy. No lytic or  sclerotic bony lesion is identified.     IMPRESSION:  Findings consistent with acute cholecystitis with gallbladder wall  thickening, dilatation and small stones seen. Extensive inflammatory  change is present about the gallbladder.    Aortoiliac atherosclerosis without aneurysm.  Mild prostatomegaly.    Diverticulosis without diverticulitis.      Electronically Signed    By: Inge Rise M.D.    On: 05/13/2014 07:20         Verified By: Ramond Dial, M.D.,    Assessment/Admission Diagnosis acute chole admit hydrate, IV abx lap chole when able, check LFT preop (not drawn in ED) IM nad crdiology consult for preop clearance in noncompliant pt son and daughter present agrees with plan   Electronic  Signatures: Florene Glen (MD)  (Signed 14-Oct-15 12:15)  Authored: CHIEF COMPLAINT and HISTORY, PAST MEDICAL/SURGIAL HISTORY, ALLERGIES, FAMILY AND SOCIAL HISTORY, REVIEW OF SYSTEMS, PHYSICAL EXAM, LABS, Radiology, ASSESSMENT AND PLAN   Last Updated: 14-Oct-15 12:15 by Florene Glen (MD)

## 2015-03-11 ENCOUNTER — Emergency Department: Payer: Medicare Other

## 2015-03-11 ENCOUNTER — Other Ambulatory Visit: Payer: Self-pay

## 2015-03-11 ENCOUNTER — Emergency Department
Admission: EM | Admit: 2015-03-11 | Discharge: 2015-03-11 | Disposition: A | Discharge status: Home / Self Care | Payer: Medicare Other | Attending: Emergency Medicine | Admitting: Emergency Medicine

## 2015-03-11 DIAGNOSIS — I10 Essential (primary) hypertension: Secondary | ICD-10-CM | POA: Diagnosis not present

## 2015-03-11 DIAGNOSIS — Z880 Allergy status to penicillin: Secondary | ICD-10-CM | POA: Diagnosis not present

## 2015-03-11 DIAGNOSIS — Z87891 Personal history of nicotine dependence: Secondary | ICD-10-CM | POA: Diagnosis not present

## 2015-03-11 DIAGNOSIS — R000 Tachycardia, unspecified: Secondary | ICD-10-CM | POA: Diagnosis not present

## 2015-03-11 DIAGNOSIS — N179 Acute kidney failure, unspecified: Secondary | ICD-10-CM | POA: Insufficient documentation

## 2015-03-11 DIAGNOSIS — R197 Diarrhea, unspecified: Secondary | ICD-10-CM | POA: Insufficient documentation

## 2015-03-11 DIAGNOSIS — E869 Volume depletion, unspecified: Secondary | ICD-10-CM | POA: Insufficient documentation

## 2015-03-11 DIAGNOSIS — R51 Headache: Secondary | ICD-10-CM | POA: Diagnosis not present

## 2015-03-11 DIAGNOSIS — R112 Nausea with vomiting, unspecified: Secondary | ICD-10-CM | POA: Diagnosis present

## 2015-03-11 DIAGNOSIS — K9289 Other specified diseases of the digestive system: Secondary | ICD-10-CM | POA: Insufficient documentation

## 2015-03-11 DIAGNOSIS — R109 Unspecified abdominal pain: Secondary | ICD-10-CM | POA: Insufficient documentation

## 2015-03-11 DIAGNOSIS — N289 Disorder of kidney and ureter, unspecified: Secondary | ICD-10-CM

## 2015-03-11 HISTORY — DX: Hyperlipidemia, unspecified: E78.5

## 2015-03-11 HISTORY — DX: Obesity, unspecified: E66.9

## 2015-03-11 HISTORY — DX: Essential (primary) hypertension: I10

## 2015-03-11 HISTORY — DX: Atherosclerotic heart disease of native coronary artery without angina pectoris: I25.10

## 2015-03-11 LAB — BASIC METABOLIC PANEL
Anion gap: 12 (ref 5–15)
BUN: 30 mg/dL — ABNORMAL HIGH (ref 6–20)
CO2: 24 mmol/L (ref 22–32)
Calcium: 8.3 mg/dL — ABNORMAL LOW (ref 8.9–10.3)
Chloride: 100 mmol/L — ABNORMAL LOW (ref 101–111)
Creatinine, Ser: 1.62 mg/dL — ABNORMAL HIGH (ref 0.61–1.24)
GFR calc Af Amer: 44 mL/min — ABNORMAL LOW (ref >60)
GFR calc non Af Amer: 38 mL/min — ABNORMAL LOW (ref >60)
GLUCOSE: 122 mg/dL — AB (ref 65–99)
POTASSIUM: 4.7 mmol/L (ref 3.5–5.1)
SODIUM: 136 mmol/L (ref 135–145)

## 2015-03-11 LAB — CBC
HEMATOCRIT: 49.3 % (ref 40.0–52.0)
Hemoglobin: 17.1 g/dL (ref 13.0–18.0)
MCH: 32.1 pg (ref 26.0–34.0)
MCHC: 34.6 g/dL (ref 32.0–36.0)
MCV: 92.8 fL (ref 80.0–100.0)
Platelets: 181 10*3/uL (ref 150–440)
RBC: 5.32 MIL/uL (ref 4.40–5.90)
RDW: 13 % (ref 11.5–14.5)
WBC: 5.3 10*3/uL (ref 3.8–10.6)

## 2015-03-11 LAB — COMPREHENSIVE METABOLIC PANEL
ALBUMIN: 4.2 g/dL (ref 3.5–5.0)
ALT: 35 U/L (ref 17–63)
ANION GAP: 13 (ref 5–15)
AST: 42 U/L — ABNORMAL HIGH (ref 15–41)
Alkaline Phosphatase: 71 U/L (ref 38–126)
BUN: 30 mg/dL — AB (ref 6–20)
CO2: 20 mmol/L — ABNORMAL LOW (ref 22–32)
Calcium: 9.3 mg/dL (ref 8.9–10.3)
Chloride: 103 mmol/L (ref 101–111)
Creatinine, Ser: 1.73 mg/dL — ABNORMAL HIGH (ref 0.61–1.24)
GFR calc Af Amer: 41 mL/min — ABNORMAL LOW (ref >60)
GFR calc non Af Amer: 35 mL/min — ABNORMAL LOW (ref >60)
Glucose, Bld: 140 mg/dL — ABNORMAL HIGH (ref 65–99)
Potassium: 3.7 mmol/L (ref 3.5–5.1)
SODIUM: 136 mmol/L (ref 135–145)
TOTAL PROTEIN: 8.4 g/dL — AB (ref 6.5–8.1)
Total Bilirubin: 0.7 mg/dL (ref 0.3–1.2)

## 2015-03-11 LAB — LIPASE, BLOOD: LIPASE: 19 U/L — AB (ref 22–51)

## 2015-03-11 MED ORDER — ONDANSETRON HCL 4 MG PO TABS: ORAL_TABLET | ORAL | Status: AC

## 2015-03-11 MED ORDER — IOHEXOL 240 MG/ML SOLN
50.0000 mL | INTRAMUSCULAR | Status: AC
  Administered 2015-03-11: 50 mL via ORAL

## 2015-03-11 MED ORDER — SODIUM CHLORIDE 0.9 % IV BOLUS (SEPSIS)
1000.0000 mL | Freq: Once | INTRAVENOUS | Status: AC
  Administered 2015-03-11: 1000 mL via INTRAVENOUS

## 2015-03-11 MED ORDER — ONDANSETRON HCL 4 MG/2ML IJ SOLN: 4.0000 mg | Freq: Once | INTRAMUSCULAR | Status: DC | PRN

## 2015-03-11 MED ORDER — ONDANSETRON HCL 4 MG/2ML IJ SOLN
4.0000 mg | INTRAMUSCULAR | Status: AC
  Administered 2015-03-11: 4 mg via INTRAVENOUS
  Filled 2015-03-11: qty 2

## 2015-03-11 NOTE — Discharge Instructions (Signed)
We believe your symptoms are caused by a viral infection.  Since your symptoms have improved, we feel it is safe for you to go home and follow up with your regular doctor.  However, you are dehydrated enough that your kidneys did suffer, and it is very important that you follow-up as soon as possible with Dr. Dareen Piano.  Please read the included information and stick to a bland diet for the next two days.  Drink plenty of clear fluids, and if you were provided with a prescription, please take it according to the label instructions.    If you develop any new or worsening symptoms, including persistent vomiting not controlled with medication, fever greater than 101, severe or worsening abdominal pain, or other symptoms that concern you, please return immediately to the Emergency Department.

## 2015-03-11 NOTE — ED Provider Notes (Signed)
[facility] Emergency Department Provider Note  ____________________________________________  Time seen: Approximately 3:34 PM  I have reviewed the triage vital signs and the nursing notes.   HISTORY  Chief Complaint Nausea; Emesis; and Diarrhea    HPI Aaron Wilkinson is a 81 y.o. male with a past medical history of coronary artery disease status post CABG, hypertension, hyperlipidemia, but is in generally good health for his age who presents with approximately 6 days of nausea, vomiting, and diarrhea.  The episodes began with diarrhea 6 days ago and persisted several days every time he tried to eat or drink anything.  Several days ago the patient also began having nausea and vomiting, primarily after anytime that he ate.  He has not observed any blood in either his emesis or his stool.  He tried taking Pepto-Bismol but believes that it made him feel worse.  He has no abdominal pain other than what he describes as some muscle soreness which he believes is due to his frequent episodes of diarrhea and vomiting.  He denies chest pain, shortness of breath, fever/chills.  Overall he describes symptoms as severe but he feels better now than he did before.  His last episode of vomiting was approximately 30 minutes prior to arrival in the emergency department.   Past Medical History  Diagnosis Date  . Coronary artery disease     s/p CABG  . Hypertension   . Hyperlipidemia   . Obesity     There are no active problems to display for this patient.   Past Surgical History  Procedure Laterality Date  . Carotid artery angioplasty    . Cholecystectomy    . Cardiac bipass      No current outpatient prescriptions on file.  Allergies Penicillins  History reviewed. No pertinent family history.  Social History Social History  Substance Use Topics  . Smoking status: Former Research scientist (life sciences)  . Smokeless tobacco: Never Used  . Alcohol Use: Yes    Review of  Systems Constitutional: No fever/chills Eyes: No visual changes. ENT: No sore throat. Cardiovascular: Denies chest pain. Respiratory: Denies shortness of breath. Gastrointestinal: Mild abdominal "soreness".  Persistent nausea/vomiting for several days, persistent diarrhea for almost 6 days.. Genitourinary: Negative for dysuria. Musculoskeletal: Negative for back pain. Skin: Negative for rash. Neurological: Mild headache, no focal weakness or numbness.  10-point ROS otherwise negative.  ____________________________________________   PHYSICAL EXAM:  VITAL SIGNS: ED Triage Vitals  Enc Vitals Group     BP 03/11/15 1244 124/83 mmHg     Pulse Rate 03/11/15 1244 93     Resp 03/11/15 1244 17     Temp 03/11/15 1244 97.4 F (36.3 C)     Temp Source 03/11/15 1244 Oral     SpO2 03/11/15 1244 97 %     Weight 03/11/15 1244 185 lb (83.915 kg)     Height 03/11/15 1244 5' 9"  (1.753 m)     Head Cir --      Peak Flow --      Pain Score 03/11/15 1246 2     Pain Loc --      Pain Edu? --      Excl. in [place]? --     Constitutional: Alert and oriented. Well appearing and in no acute distress.  Appears generally than neurological age. Eyes: Conjunctivae are normal. PERRL. EOMI. Head: Atraumatic. Nose: No congestion/rhinnorhea. Mouth/Throat: Mucous membranes are moist.  Oropharynx non-erythematous. Neck: No stridor.   Cardiovascular: Borderline tachycardia with rates in the  90s, regular rhythm. Grossly normal heart sounds.  Good peripheral circulation. Respiratory: Normal respiratory effort.  No retractions. Lungs CTAB. Gastrointestinal: Soft and nontender to deep palpation. No distention. No abdominal bruits. No CVA tenderness. Musculoskeletal: No lower extremity tenderness nor edema.  No joint effusions. Neurologic:  Normal speech and language. No gross focal neurologic deficits are appreciated.  Skin:  Skin is warm, dry and intact. No rash noted. Psychiatric: Mood and affect are normal.  Speech and behavior are normal.  The patient is asking when he can go home.  ____________________________________________   LABS (all labs ordered are listed, but only abnormal results are displayed)  Labs Reviewed  LIPASE, BLOOD - Abnormal; Notable for the following:    Lipase 19 (*)    All other components within normal limits  COMPREHENSIVE METABOLIC PANEL - Abnormal; Notable for the following:    CO2 20 (*)    Glucose, Bld 140 (*)    BUN 30 (*)    Creatinine, Ser 1.73 (*)    Total Protein 8.4 (*)    AST 42 (*)    GFR calc non Af Amer 35 (*)    GFR calc Af Amer 41 (*)    All other components within normal limits  CBC  URINALYSIS COMPLETEWITH MICROSCOPIC ([facility] ONLY)  BASIC METABOLIC PANEL   ____________________________________________  EKG  ED ECG REPORT I, Neal Trulson, the attending physician, personally viewed and interpreted this ECG.  Date: 03/11/2015 EKG Time: 16:13 Rate: 69 Rhythm: normal sinus rhythm QRS Axis: normal Intervals: normal ST/T Wave abnormalities: Non-specific ST segment / T-wave changes, but no evidence of acute ischemia. Conduction Disutrbances: none Narrative Interpretation: Consistent with prior EKG from 05/13/2014 with no evidence of acute ischemia  ____________________________________________  RADIOLOGY  Ct Abdomen Pelvis Wo Contrast  03/11/2015   CLINICAL DATA:  Nausea, vomiting, and diarrhea. Acute renal failure.  EXAM: CT ABDOMEN AND PELVIS WITHOUT CONTRAST  TECHNIQUE: Multidetector CT imaging of the abdomen and pelvis was performed following the standard protocol without IV contrast.  COMPARISON:  05/13/2014  FINDINGS: No consolidation or effusion in the lung bases.  The gallbladder is surgically absent. No biliary dilatation is seen. No focal liver lesion is identified. The spleen, adrenal glands, and pancreas have an unremarkable unenhanced appearance. The kidneys have an unremarkable unenhanced appearance without hydronephrosis.   Oral contrast partially visualized in the distal esophagus may reflect esophageal dysmotility or reflux. A diverticulum is again seen involving the third portion of the duodenum, slightly less distended than on the prior study. Oral contrast is present in multiple nondilated loops of small bowel. There is no evidence of bowel obstruction. Sigmoid and descending colon diverticulosis is again noted without evidence of diverticulitis. Liquid stool in the colon is consistent with provided history of diarrheal illness. The appendix is likely identified in the right lower quadrant as a collapsed tubular soft tissue density, without evidence of appendicitis.  Bladder is largely decompressed. Moderate aortoiliac atherosclerotic calcification is noted. Prior left inguinal hernia repair is again noted. No free fluid or enlarged lymph nodes are seen. Mild prostatic enlargement is unchanged. Small L5 superior endplate Schmorl's node is unchanged.  IMPRESSION: 1. Colonic diverticulosis without evidence of diverticulitis. 2. No bowel obstruction.   Electronically Signed   By: Logan Bores   On: 03/11/2015 17:49    ____________________________________________   PROCEDURES  Procedure(s) performed: None  Critical Care performed: No ____________________________________________   INITIAL IMPRESSION / ASSESSMENT AND PLAN / ED COURSE  Pertinent labs & imaging results  that were available during my care of the patient were reviewed by me and considered in my medical decision making (see chart for details).  In spite of the patient's age and history, he is quite well-appearing and in no acute distress at this time.  He is almost finished with a 1 L normal saline fluid bolus and states that the artery feels better.  He has not vomited since coming to the emergency Department and receiving a dose of Zofran.  He has no abdominal tenderness to palpation, which is reassuring.    He does, however, have acute renal  insufficiency with an elevated BUN and creatinine.  This is understandable given his recent symptoms.  The patient very much wants to go home, but given his age, duration of symptoms, and evidence of end organ dysfunction, I will further evaluate with a CT scan of his abdomen and pelvis with by mouth contrast only given his reduced GFR.  This will also serve as a by mouth challenge.  After the completion of his 1 L normal saline bolus I will repeat a basic metabolic panel to determine if his GFR is improving.  He may be appropriate for discharge if all these conditions are met.  I discussed this with the patient and his daughter and they both understand and agree with the plan.  I am also obtaining an EKG to compare against his prior, although with no history of chest pain or shortness of breath, I will not obtain a troponin at this time.  ----------------------------------------- 7:27 PM on 03/11/2015 -----------------------------------------  The patient improved significantly after his IV fluids.  He had a by mouth challenge with no issues.  I had another extensive discussion with him and his family and he very much wants to go home.  Given his improvement in the repeat BMP and his ability to tolerate by mouth fluids, I agreed to discharge him if he follows up as soon as possible with his primary care doctor.  I gave him extensive instructions about drinking plenty of by mouth fluids and advancing his diet slowly.  His CT scan was reassuring with no evidence of colitis so I did not order any antibiotics.  The patient is very comfortable with this plan and has a family member to stay with him tonight.  I gave my usual and customary return precautions. ____________________________________________  FINAL CLINICAL IMPRESSION(S) / ED DIAGNOSES  Final diagnoses:  Non-intractable vomiting with nausea, vomiting of unspecified type  Diarrhea  Volume depletion, gastrointestinal loss  Acute renal  insufficiency      NEW MEDICATIONS STARTED DURING THIS VISIT:  New Prescriptions   No medications on file     Hinda Kehr, MD 03/12/15 [phone]

## 2015-03-11 NOTE — ED Notes (Signed)
Pt c/o diarrhea starting Friday night, then starting having N/V Sunday and has continued til now.Aaron Wilkinson

## 2016-10-24 ENCOUNTER — Encounter (INDEPENDENT_AMBULATORY_CARE_PROVIDER_SITE_OTHER): Payer: Self-pay

## 2016-10-24 ENCOUNTER — Ambulatory Visit (INDEPENDENT_AMBULATORY_CARE_PROVIDER_SITE_OTHER): Payer: Self-pay | Admitting: Vascular Surgery

## 2016-11-23 ENCOUNTER — Other Ambulatory Visit (INDEPENDENT_AMBULATORY_CARE_PROVIDER_SITE_OTHER): Payer: Self-pay | Admitting: Vascular Surgery

## 2016-11-23 DIAGNOSIS — I6523 Occlusion and stenosis of bilateral carotid arteries: Secondary | ICD-10-CM

## 2016-11-24 ENCOUNTER — Ambulatory Visit (INDEPENDENT_AMBULATORY_CARE_PROVIDER_SITE_OTHER): Payer: Medicare Other | Admitting: Vascular Surgery

## 2016-11-24 ENCOUNTER — Ambulatory Visit (INDEPENDENT_AMBULATORY_CARE_PROVIDER_SITE_OTHER): Payer: Medicare Other

## 2016-11-24 ENCOUNTER — Encounter (INDEPENDENT_AMBULATORY_CARE_PROVIDER_SITE_OTHER): Payer: Self-pay | Admitting: Vascular Surgery

## 2016-11-24 VITALS — BP 167/87 | HR 60 | Resp 16 | Ht 69.0 in | Wt 180.0 lb

## 2016-11-24 DIAGNOSIS — I10 Essential (primary) hypertension: Secondary | ICD-10-CM | POA: Diagnosis not present

## 2016-11-24 DIAGNOSIS — I6529 Occlusion and stenosis of unspecified carotid artery: Secondary | ICD-10-CM | POA: Insufficient documentation

## 2016-11-24 DIAGNOSIS — I6523 Occlusion and stenosis of bilateral carotid arteries: Secondary | ICD-10-CM

## 2016-11-24 NOTE — Patient Instructions (Addendum)
Atherosclerosis Atherosclerosis is narrowing and hardening of the blood vessels (arteries). Arteries are tubes that carry blood that contains oxygen from the heart to all parts of the body. Arteries can become narrow or clogged with a buildup of fat, cholesterol, calcium, or other substances (plaque). Plaque decreases the amount of blood that can flow through the artery. Atherosclerosis can affect any artery in the body, including:  Heart arteries (coronary artery disease), which may cause heart attack.  Brain arteries, which may cause stroke.  Leg, arm, and pelvis arteries (peripheral artery disease), which may cause pain and numbness.  Kidney arteries, which may cause kidney (renal) failure. Treatment may slow the disease and prevent further damage to the heart, brain, peripheral arteries, and kidneys. What are the causes? Atherosclerosis develops when plaque forms in an artery. This damages the inside wall of the artery. Over time, the plaque grows and hardens. It may break through the artery wall. This causes a blood clot to form over the break, which narrows the artery more. The clot may also break loose and travel to other arteries, causing more damage. What increases the risk? This condition is more likely to develop in people who:  Are middle age or older.  Have a family history of atherosclerosis.  Have high cholesterol.  Have high blood fats (triglycerides).  Have diabetes.  Are overweight.  Smoke tobacco.  Do not exercise enough.  Have a substance in the blood that indicates increased levels of inflammation in the body (C-reactive protein, or CRP).  Have sleep apnea.  Are stressed.  Drink too much alcohol. What are the signs or symptoms? This condition may not cause any symptoms. If you do have symptoms, they are caused by damage to an area of your body that is not getting enough blood. The following symptoms are possible:  Coronary artery disease may cause chest  pain and shortness of breath.  Decreased blood supply to your brain may cause a stroke. Signs and symptoms of stroke may include sudden:  Weakness on one side of the body.  Confusion.  Changes in vision.  Inability to speak or understand speech.  Loss of balance, coordination, or ability to walk.  Severe headache.  Loss of consciousness.  Peripheral artery disease may cause pain and numbness, often in the legs and hips.  Renal failure may cause fatigue, nausea, swelling, and itchy skin. How is this diagnosed? This condition is diagnosed based on your medical history and a physical exam. During the exam, your health care provider will check your pulses and listen for a "whooshing" sound over your arteries (bruit). You may have tests, such as:  Blood tests to check your levels of cholesterol, triglycerides, and CRP.  Electrocardiogram (ECG) to check for heart damage.  Chest X-ray to see if your heart is enlarged, which is a sign of heart failure.  Stress test to see how your heart reacts to exercise.  Echocardiogram to get images of the inside of your heart.  Ankle-Brachial index to compare blood pressure in your arms to blood pressure in your ankles.  Ultrasound of your peripheral arteries to check blood flow.  CT scan to check for damage to your heart or brain.  X-rays of blood vessels after dye has been injected (angiogram) to check blood flow. How is this treated? Treatment starts with lifestyle changes, which may include:  Changing your diet.  Losing weight.  Reducing stress.  Exercising and being more physically active.  Not smoking. You also may need medicine   to:  Lower triglycerides and cholesterol.  Lower and control blood pressure.  Prevent blood clots.  Lower inflammation in your body.  Lower and control your blood sugar. Sometimes, surgery is needed to remove plaque, widen your artery, or create a new path for your blood (bypass). Surgical  treatment may include:  Removing plaque from an artery (endarterectomy).  Opening a narrowed heart artery (angioplasty).  Heart or peripheral artery bypass graft surgery. Follow these instructions at home: Lifestyle    Eat a heart-healthy diet. Talk to your health care provider or a diet specialist (dietitian) if you need help. A heart-healthy diet includes:  Limiting unhealthy fats and increasing healthy fats. Some examples of healthy fats are olive oil and canola oil.  Eating plant-based foods, such as fruits, vegetables, nuts, legumes, and whole grains.  Follow an exercise program as told by your health care provider.  Maintain a healthy weight. Lose weight if directed by your health care provider.  Rest when you are tired.  Learn to manage your stress.  Do not use any tobacco products, such as cigarettes, chewing tobacco, and e-cigarettes. If you need help quitting, ask your health care provider.  Limit alcohol intake to no more than 1 drink a day for nonpregnant women and 2 drinks a day for men. One drink equals 12 oz of beer, 5 oz of wine, or 1 oz of hard liquor.  Do not abuse drugs. General instructions   Take over-the-counter and prescription medicines only as told by your health care provider.  Manage other health conditions as told by your health care provider.  Keep all follow-up visits as told by your health care provider. This is important. Contact a health care provider if:  You have chest pain or discomfort. This includes squeezing chest pain that may feel like indigestion (angina).  You have shortness of breath.  You have an irregular heartbeat.  You have unexplained fatigue.  You have unexplained pain or numbness in an arm, leg, or hip.  You have nausea, swelling of your hands or feet, and itchy skin. Get help right away if:  You have symptoms of a heart attack, such as:  Chest pain.  Shortness of breath.  Pain in your neck, jaw, arms, back,  or stomach.  Cold sweat.  Nausea.  Light-headedness.  You have symptoms of a stroke, such as sudden:  Weakness on one side of your body.  Confusion.  Changes in vision.  Inability to speak or understand speech.  Loss of balance, coordination, or ability to walk.  Severe headache.  Loss of consciousness. These symptoms may represent a serious problem that is an emergency. Do not wait to see if the symptoms will go away. Get medical help right away. Call your local emergency services (911 in the [place] Do not drive yourself to the hospital. This information is not intended to replace advice given to you by your health care provider. Make sure you discuss any questions you have with your health care provider. Document Released: 10/07/2003 Document Revised: 12/23/2015 Document Reviewed: 06/07/2015 Elsevier Interactive Patient Education  2017 [employer]  

## 2016-11-24 NOTE — Assessment & Plan Note (Addendum)
His carotid duplex today reveals a widely patent right carotid endarterectomy with no recurrent stenosis. He has stable 40-59% stenosis in the left internal carotid artery. When compared to his study from last year, there has been no significant change. Will continue to follow this on an annual basis. Continue aspirin and statin agent.

## 2016-11-24 NOTE — Assessment & Plan Note (Signed)
blood pressure control important in reducing the progression of atherosclerotic disease. On appropriate oral medications.  

## 2016-11-24 NOTE — Progress Notes (Signed)
MRN : [id number]  Aaron Wilkinson is a 83 y.o. ([birth year]) male who presents with chief complaint of  Chief Complaint  Patient presents with  . Carotid    1 year ultrasound follow up  .  History of Present Illness: Patient returns in follow-up of carotid disease. He is doing well today without specific complaints. He is almost 5 years status post right carotid endarterectomy for high-grade stenosis. He denies any focal neurologic symptoms. Specifically, the patient denies amaurosis fugax, speech or swallowing difficulties, or arm or leg weakness or numbness. His carotid duplex today reveals a widely patent right carotid endarterectomy with no recurrent stenosis. He has stable 40-59% stenosis in the left internal carotid artery. When compared to his study from last year, there has been no significant change.  Current Outpatient Prescriptions  Medication Sig Dispense Refill  . ALPRAZolam (XANAX) 0.25 MG tablet Take 0.125-0.25 mg by mouth 2 (two) times daily as needed for anxiety.     Marland Kitchen amLODipine (NORVASC) 5 MG tablet Take by mouth.    Marland Kitchen aspirin EC 81 MG tablet Take by mouth.    Marland Kitchen atorvastatin (LIPITOR) 10 MG tablet Take 10 mg by mouth at bedtime.    . ondansetron (ZOFRAN) 4 MG tablet Take 1-2 tabs by mouth every 8 hours as needed for nausea/vomiting 30 tablet 0   No current facility-administered medications for this visit.     Past Medical History:  Diagnosis Date  . Coronary artery disease    s/p CABG  . Hyperlipidemia   . Hypertension   . Obesity     Past Surgical History:  Procedure Laterality Date  . cardiac bipass    . CAROTID ARTERY ANGIOPLASTY    . CHOLECYSTECTOMY      Social History Social History  Substance Use Topics  . Smoking status: Former Games developer  . Smokeless tobacco: Never Used  . Alcohol use Yes     Family History No bleeding or clotting disorders  Allergies  Allergen Reactions  . Penicillins Anaphylaxis  . Atorvastatin Diarrhea  .  Fenofibrate     Other reaction(s): Muscle Pain  . Lovastatin     Other reaction(s): Muscle Pain  . Pravastatin     Other reaction(s): Muscle Pain  . Statins Other (See Comments)    Reaction:  Muscle pain      REVIEW OF SYSTEMS (Negative unless checked)  Constitutional: Weight loss  Fever  Chills Cardiac: Chest pain   Chest pressure   Palpitations   Shortness of breath when laying flat   Shortness of breath at rest   Shortness of breath with exertion. Vascular:  Pain in legs with walking   Pain in legs at rest   Pain in legs when laying flat   Claudication   Pain in feet when walking  Pain in feet at rest  Pain in feet when laying flat   History of DVT   Phlebitis   Swelling in legs   Varicose veins   Non-healing ulcers Pulmonary:   Uses home oxygen   Productive cough   Hemoptysis   Wheeze  COPD   Asthma Neurologic:  Dizziness  Blackouts   Seizures   History of stroke   History of TIA  Aphasia   Temporary blindness   Dysphagia   Weakness or numbness in arms   Weakness or numbness in legs Musculoskeletal:  Arthritis   Joint swelling   Joint pain   Low back pain Hematologic:  Easy bruising  Easy  bleeding   Hypercoagulable state   Anemic  Hepatitis Gastrointestinal:  Blood in stool   Vomiting blood  Gastroesophageal reflux/heartburn   Difficulty swallowing. Genitourinary:  Chronic kidney disease   Difficult urination  Frequent urination  Burning with urination   Blood in urine Skin:  Rashes   Ulcers   Wounds Psychological:  History of anxiety    History of major depression.  Physical Examination  Vitals:   11/24/16 1106 11/24/16 1107  BP: (!) 162/95 (!) 167/87  Pulse: 60   Resp: 16   Weight: 180 lb (81.6 kg)   Height:  (1.753 m)    Body mass index is 26.58 kg/m. Gen:  WD/WN, NAD. Appears younger than stated age Head: [place]/AT, No temporalis  wasting. Ear/Nose/Throat: Hearing grossly intact, nares w/o erythema or drainage, trachea midline Eyes: Conjunctiva clear. Sclera non-icteric Neck: Supple.  No bruit or JVD.  Pulmonary:  Good air movement, equal and clear to auscultation bilaterally.  Cardiac: RRR, normal S1, S2, no Murmurs, rubs or gallops. Vascular:  Vessel Right Left  Radial Palpable Palpable                                   Gastrointestinal: soft, non-tender/non-distended. No guarding/reflex.  Musculoskeletal: M/S 5/5 throughout.  No deformity or atrophy.  Neurologic: CN 2-12 intact. Sensation grossly intact in extremities.  Symmetrical.  Speech is fluent. Motor exam as listed above. Psychiatric: Judgment intact, Mood & affect appropriate for pt's clinical situation. Dermatologic: No rashes or ulcers noted.  No cellulitis or open wounds.      CBC Lab Results  Component Value Date   WBC 5.3 03/11/2015   HGB 17.1 03/11/2015   HCT 49.3 03/11/2015   MCV 92.8 03/11/2015   PLT 181 03/11/2015    BMET    Component Value Date/Time   NA 136 03/11/2015 1626   NA 141 05/17/2014 0512   K 4.7 03/11/2015 1626   K 4.1 05/17/2014 0512   CL 100 (L) 03/11/2015 1626   CL 102 05/17/2014 0512   CO2 24 03/11/2015 1626   CO2 32 05/17/2014 0512   GLUCOSE 122 (H) 03/11/2015 1626   GLUCOSE 118 (H) 05/17/2014 0512   BUN 30 (H) 03/11/2015 1626   BUN 7 05/17/2014 0512   CREATININE 1.62 (H) 03/11/2015 1626   CREATININE 0.94 05/17/2014 0512   CALCIUM 8.3 (L) 03/11/2015 1626   CALCIUM 7.9 (L) 05/17/2014 0512   GFRNONAA 38 (L) 03/11/2015 1626   GFRNONAA >60 05/17/2014 0512   GFRNONAA >60 04/25/2012 0557   GFRAA 44 (L) 03/11/2015 1626   GFRAA >60 05/17/2014 0512   GFRAA >60 04/25/2012 0557   CrCl cannot be calculated (Patient's most recent lab result is older than the maximum 21 days allowed.).  COAG Lab Results  Component Value Date   INR 0.9 04/25/2012    Radiology No results  found.   Assessment/Plan Essential hypertension, benign blood pressure control important in reducing the progression of atherosclerotic disease. On appropriate oral medications.   Carotid stenosis His carotid duplex today reveals a widely patent right carotid endarterectomy with no recurrent stenosis. He has stable 40-59% stenosis in the left internal carotid artery. When compared to his study from last year, there has been no significant change. Will continue to follow this on an annual basis. Continue aspirin and statin agent.    Festus Barren, MD  11/24/2016 1:09 PM    This note  was created with Dragon medical transcription system.  Any errors from dictation are purely unintentional

## 2016-12-17 IMAGING — CT CT ABD-PELV W/O CM
2 of 4 series · 16 of 46 positions shown, 18 images · non-contrast
Comparison: 05/13/2014

CLINICAL DATA: Nausea, vomiting, and diarrhea. Acute renal failure.

EXAM:
CT ABDOMEN AND PELVIS WITHOUT CONTRAST
TECHNIQUE: Multidetector CT imaging of the abdomen and pelvis was performed
following the standard protocol without IV contrast.

[Series 2: routine abd pel wo · axial · 0.86mm/px · z∈[+238,+668]mm · 13 of 96 slices shown, 15 images]
[im 5/96  soft-tissue]
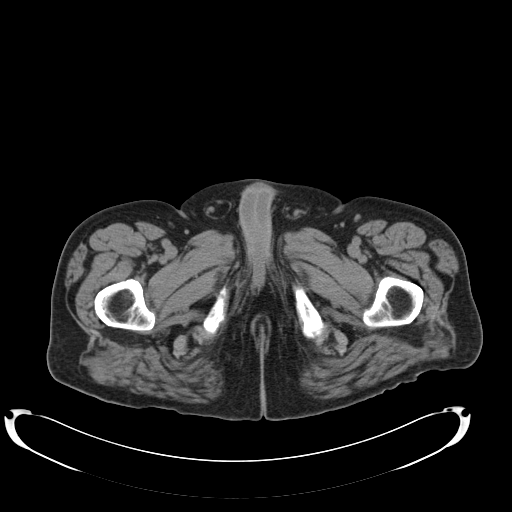
[im 5/96  bone]
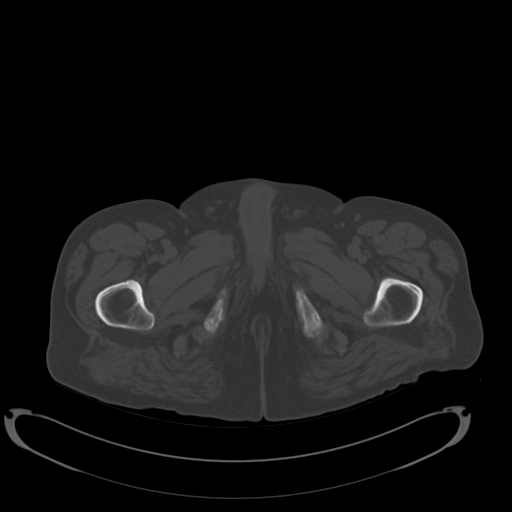
[im 13/96  soft-tissue]
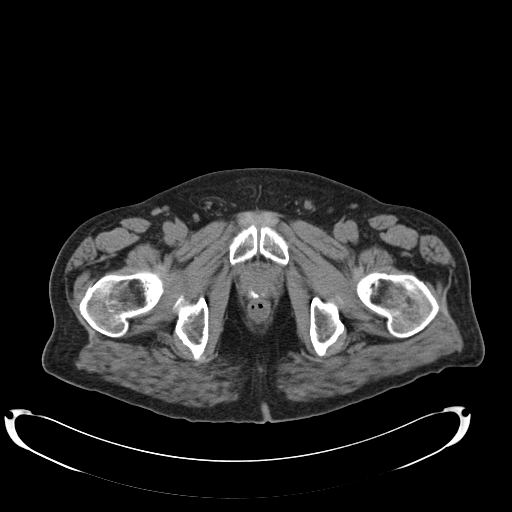
[im 21/96  soft-tissue]
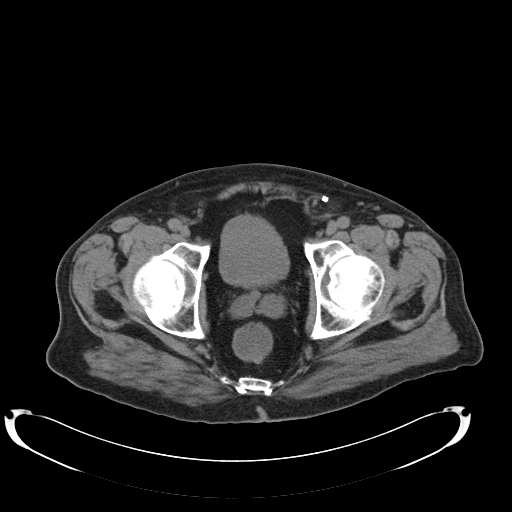
[im 25/96  soft-tissue]
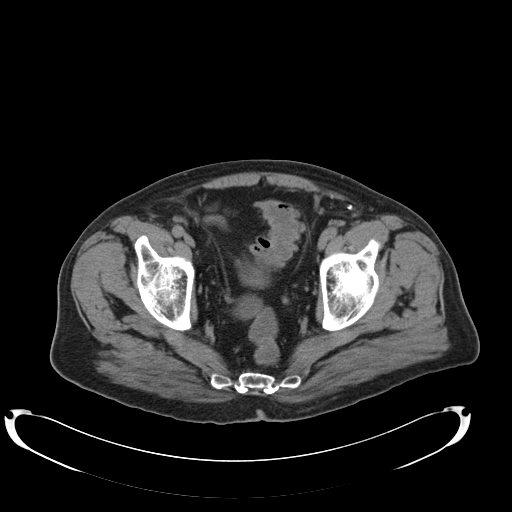
[im 34/96  soft-tissue]
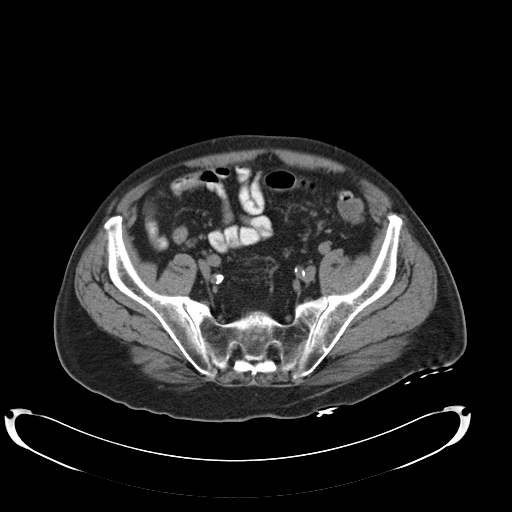
[im 42/96  soft-tissue]
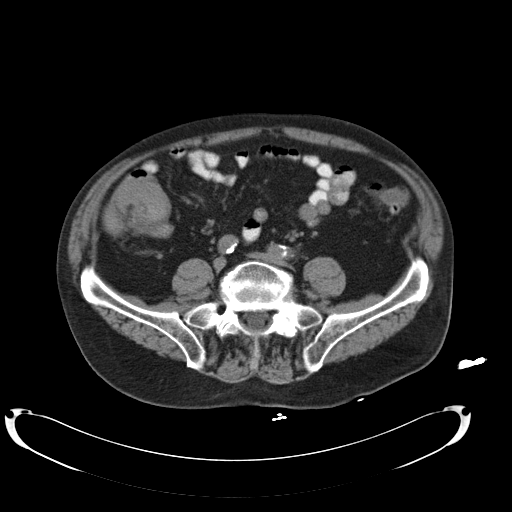
[im 50/96  soft-tissue]
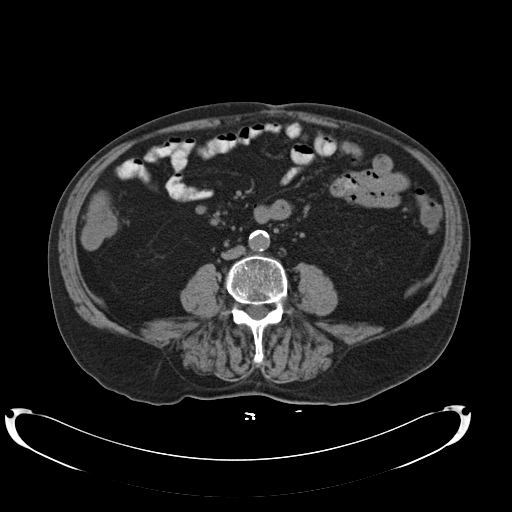
[im 54/96  soft-tissue]
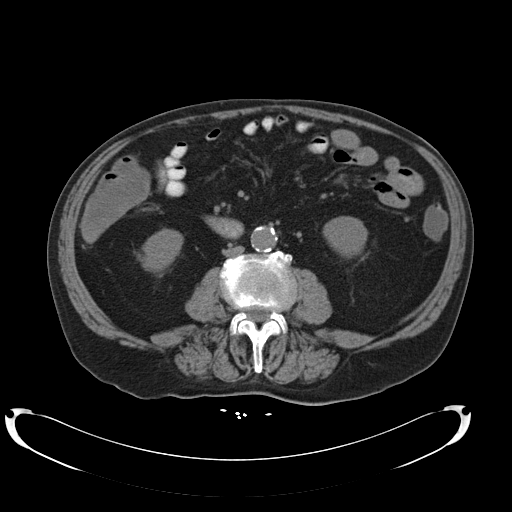
[im 62/96  soft-tissue]
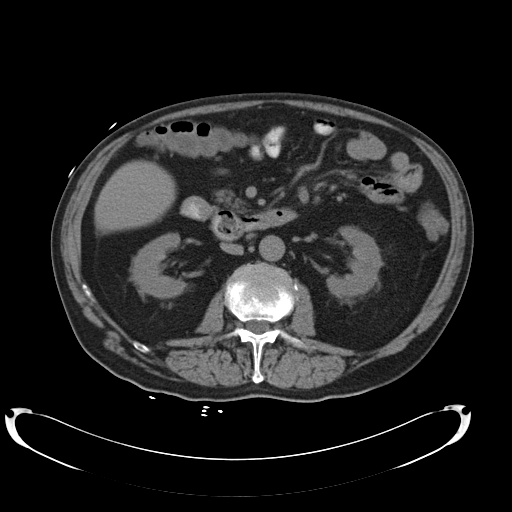
[im 62/96  bone]
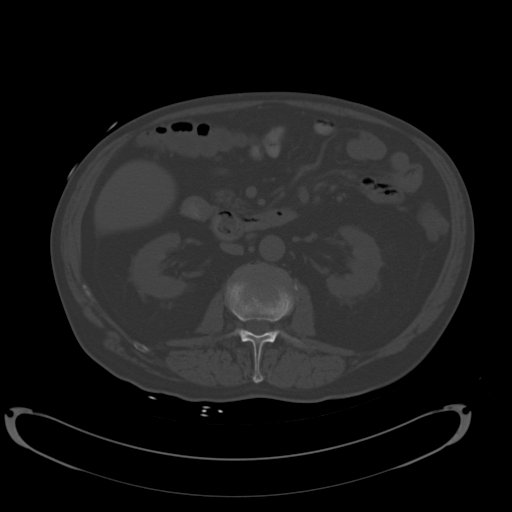
[im 71/96  soft-tissue]
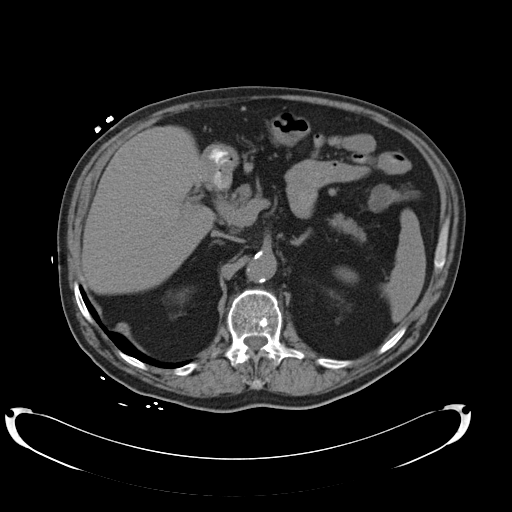
[im 75/96  soft-tissue]
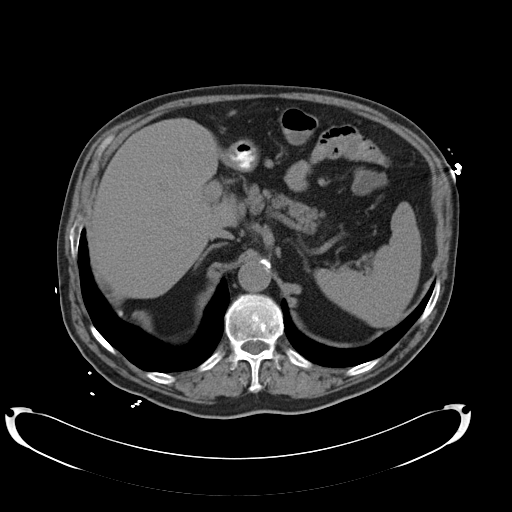
[im 83/96  soft-tissue]
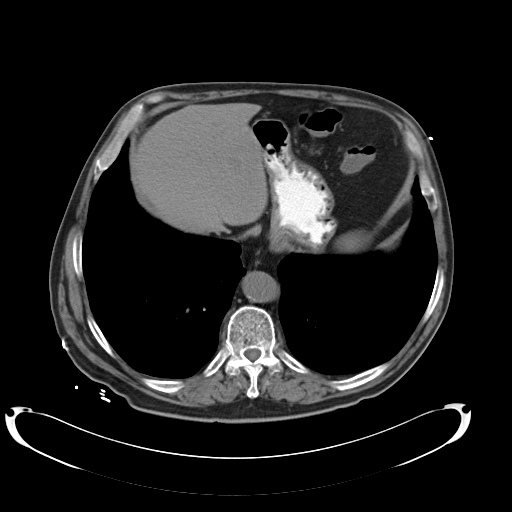
[im 91/96  soft-tissue]
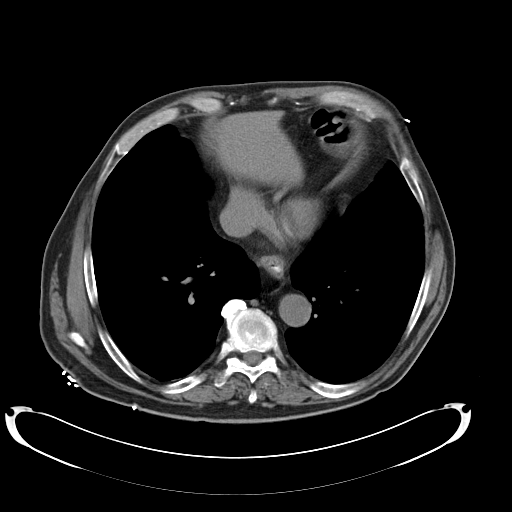

[Series 5: cor routine abd pel wo · coronal · 0.77mm/px · 3 of 163 slices shown]
[im 55/163  soft-tissue]
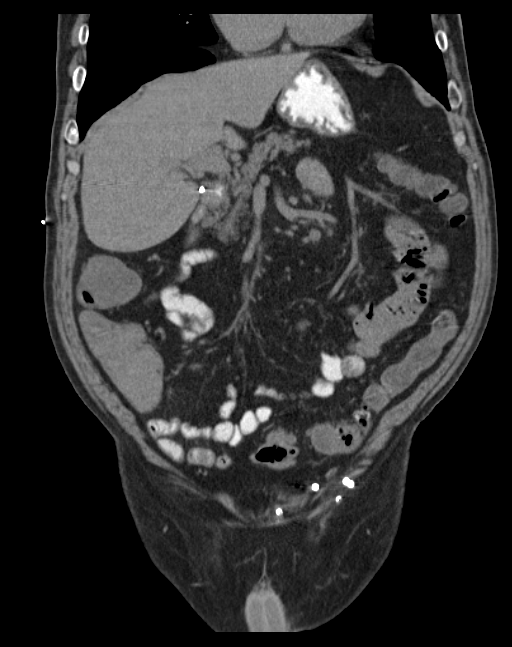
[im 73/163  soft-tissue]
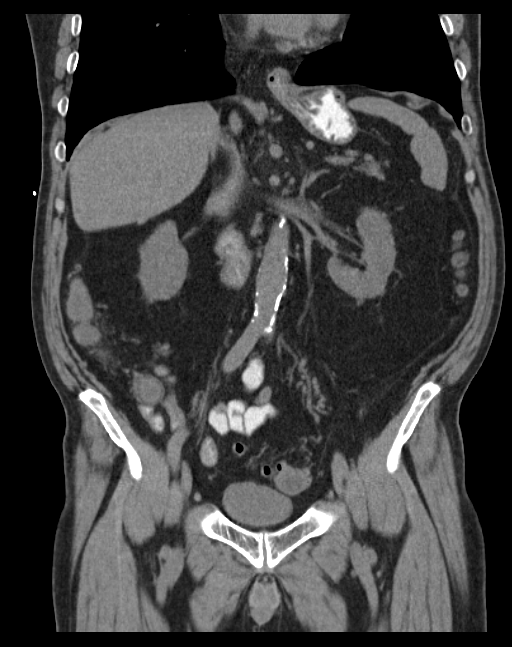
[im 91/163  soft-tissue]
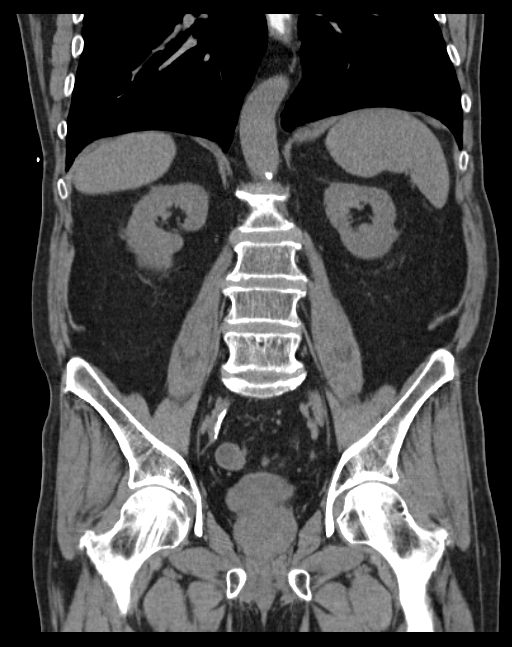

[16 of 46 positions shown; findings below may reference images not displayed]

FINDINGS: No consolidation or effusion in the lung bases.

The gallbladder is surgically absent. No biliary dilatation is seen.
No focal liver lesion is identified. The spleen, adrenal glands, and
pancreas have an unremarkable unenhanced appearance. The kidneys
have an unremarkable unenhanced appearance without hydronephrosis.

Oral contrast partially visualized in the distal esophagus may
reflect esophageal dysmotility or reflux. A diverticulum is again
seen involving the third portion of the duodenum, slightly less
distended than on the prior study. Oral contrast is present in
multiple nondilated loops of small bowel. There is no evidence of
bowel obstruction. Sigmoid and descending colon diverticulosis is
again noted without evidence of diverticulitis. Liquid stool in the
colon is consistent with provided history of diarrheal illness. The
appendix is likely identified in the right lower quadrant as a
collapsed tubular soft tissue density, without evidence of
appendicitis.

Bladder is largely decompressed. Moderate aortoiliac atherosclerotic
calcification is noted. Prior left inguinal hernia repair is again
noted. No free fluid or enlarged lymph nodes are seen. Mild
prostatic enlargement is unchanged. Small L5 superior endplate
Schmorl's node is unchanged.
IMPRESSION: 1. Colonic diverticulosis without evidence of diverticulitis.
2. No bowel obstruction.

## 2017-11-27 ENCOUNTER — Encounter (INDEPENDENT_AMBULATORY_CARE_PROVIDER_SITE_OTHER): Payer: Self-pay | Admitting: Vascular Surgery

## 2017-11-27 ENCOUNTER — Ambulatory Visit (INDEPENDENT_AMBULATORY_CARE_PROVIDER_SITE_OTHER): Payer: Medicare Other | Admitting: Vascular Surgery

## 2017-11-27 ENCOUNTER — Ambulatory Visit (INDEPENDENT_AMBULATORY_CARE_PROVIDER_SITE_OTHER): Payer: Medicare Other

## 2017-11-27 VITALS — BP 140/82 | HR 76 | Resp 15 | Ht 67.5 in | Wt 176.0 lb

## 2017-11-27 DIAGNOSIS — I6523 Occlusion and stenosis of bilateral carotid arteries: Secondary | ICD-10-CM

## 2017-11-27 DIAGNOSIS — I10 Essential (primary) hypertension: Secondary | ICD-10-CM

## 2017-11-27 NOTE — Patient Instructions (Signed)
Carotid Artery Disease The carotid arteries are arteries on both sides of the neck. They carry blood to the brain. Carotid artery disease is when the arteries get smaller (narrow) or get blocked. If these arteries get smaller or get blocked, you are more likely to have a stroke or warning stroke (transient ischemic attack). Follow these instructions at home:  Take medicines as told by your doctor. Make sure you understand all your medicine instructions. Do not stop your medicines without talking to your doctor first.  Follow your doctor's diet instructions. It is important to eat a healthy diet that includes plenty of: ? Fresh fruits. ? Vegetables. ? Lean meats.  Avoid: ? High-fat foods. ? High-sodium foods. ? Foods that are fried, overly processed, or have poor nutritional value.  Stay a healthy weight.  Stay active. Get at least 30 minutes of activity every day.  Do not smoke.  Limit alcohol use to: ? No more than 2 drinks a day for men. ? No more than 1 drink a day for women who are not pregnant.  Do not use illegal drugs.  Keep all doctor visits as told. Get help right away if:  You have sudden weakness or loss of feeling (numbness) on one side of the body, such as the face, arm, or leg.  You have sudden confusion.  You have trouble speaking (aphasia) or understanding.  You have sudden trouble seeing out of one or both eyes.  You have sudden trouble walking.  You have dizziness or feel like you might pass out (faint).  You have a loss of balance or your movements are not steady (uncoordinated).  You have a sudden, severe headache with no known cause.  You have trouble swallowing (dysphagia). Call your local emergency services (911 in [place] Do notdrive yourself to the clinic or hospital. This information is not intended to replace advice given to you by your health care provider. Make sure you discuss any questions you have with your health care  provider. Document Released: 07/03/2012 Document Revised: 12/23/2015 Document Reviewed: 01/15/2013 Elsevier Interactive Patient Education  2018 Elsevier Inc.  

## 2017-11-27 NOTE — Progress Notes (Signed)
MRN : [id number]  Aaron Wilkinson is a 84 y.o. (09-Aug-1929) male who presents with chief complaint of  Chief Complaint  Patient presents with  . Carotid    1 year follow up  .  History of Present Illness: Patient returns in follow-up of his carotid disease.  He is now 5 to 6 years status post right carotid endarterectomy.  He is doing well today without any significant problems.  His carotid duplex shows a patent right carotid endarterectomy.  His left carotid velocities are slightly lower than they were last year and are now consistent with a 1 to 39% stenosis.  Current Outpatient Prescriptions  Medication Sig Dispense Refill  . ALPRAZolam (XANAX) 0.25 MG tablet Take 0.125-0.25 mg by mouth 2 (two) times daily as needed for anxiety.     Marland Kitchen amLODipine (NORVASC) 5 MG tablet Take by mouth.    Marland Kitchen aspirin EC 81 MG tablet Take by mouth.    Marland Kitchen atorvastatin (LIPITOR) 10 MG tablet Take 10 mg by mouth at bedtime.    . ondansetron (ZOFRAN) 4 MG tablet Take 1-2 tabs by mouth every 8 hours as needed for nausea/vomiting 30 tablet 0   No current facility-administered medications for this visit.         Past Medical History:  Diagnosis Date  . Coronary artery disease    s/p CABG  . Hyperlipidemia   . Hypertension   . Obesity          Past Surgical History:  Procedure Laterality Date  . cardiac bipass    . CAROTID ARTERY ANGIOPLASTY    . CHOLECYSTECTOMY      Social History     Social History  Substance Use Topics  . Smoking status: Former Games developer  . Smokeless tobacco: Never Used  . Alcohol use Yes     Family History No bleeding or clotting disorders  Allergies  Allergen Reactions  . Penicillins Anaphylaxis  . Atorvastatin Diarrhea  . Fenofibrate     Other reaction(s): Muscle Pain  . Lovastatin     Other reaction(s): Muscle Pain  . Pravastatin     Other reaction(s): Muscle Pain  . Statins Other (See Comments)    Reaction:   Muscle pain      REVIEW OF SYSTEMS (Negative unless checked)  Constitutional: Weight loss  Fever  Chills Cardiac: Chest pain   Chest pressure   Palpitations   Shortness of breath when laying flat   Shortness of breath at rest   Shortness of breath with exertion. Vascular:  Pain in legs with walking   Pain in legs at rest   Pain in legs when laying flat   Claudication   Pain in feet when walking  Pain in feet at rest  Pain in feet when laying flat   History of DVT   Phlebitis   Swelling in legs   Varicose veins   Non-healing ulcers Pulmonary:   Uses home oxygen   Productive cough   Hemoptysis   Wheeze  COPD   Asthma Neurologic:  Dizziness  Blackouts   Seizures   History of stroke   History of TIA  Aphasia   Temporary blindness   Dysphagia   Weakness or numbness in arms   Weakness or numbness in legs Musculoskeletal:  Arthritis   Joint swelling   Joint pain   Low back pain Hematologic:  Easy bruising  Easy bleeding   Hypercoagulable state   Anemic  Hepatitis Gastrointestinal:  Blood in stool     Vomiting blood  Gastroesophageal reflux/heartburn   Difficulty swallowing. Genitourinary:  Chronic kidney disease   Difficult urination  Frequent urination  Burning with urination   Blood in urine Skin:  Rashes   Ulcers   Wounds Psychological:  History of anxiety    History of major depression.      Physical Examination  Vitals:   11/27/17 1132 11/27/17 1133  BP: 135/81 140/82  Pulse: 72 76  Resp: 15   Weight: 79.8 kg (176 lb)   Height: 5' 7.5" (1.715 m)    Body mass index is 27.16 kg/m. Gen:  WD/WN, NAD.  Appears younger than stated age Head: [place]/AT, No temporalis wasting. Ear/Nose/Throat: Hearing grossly intact, nares w/o erythema or drainage, trachea midline Eyes: Conjunctiva clear. Sclera non-icteric Neck: Supple.  No bruit  Pulmonary:  Good air movement, equal and  clear to auscultation bilaterally.  Cardiac: RRR, No JVD Vascular:  Vessel Right Left  Radial Palpable Palpable                                   Musculoskeletal: M/S 5/5 throughout.  No deformity or atrophy.  No edema. Neurologic: CN 2-12 intact. Sensation grossly intact in extremities.  Symmetrical.  Speech is fluent. Motor exam as listed above. Psychiatric: Judgment intact, Mood & affect appropriate for pt's clinical situation. Dermatologic: No rashes or ulcers noted.  No cellulitis or open wounds. Lymph : No Cervical, Axillary, or Inguinal lymphadenopathy.     CBC Lab Results  Component Value Date   WBC 5.3 03/11/2015   HGB 17.1 03/11/2015   HCT 49.3 03/11/2015   MCV 92.8 03/11/2015   PLT 181 03/11/2015    BMET    Component Value Date/Time   NA 136 03/11/2015 1626   NA 141 05/17/2014 0512   K 4.7 03/11/2015 1626   K 4.1 05/17/2014 0512   CL 100 (L) 03/11/2015 1626   CL 102 05/17/2014 0512   CO2 24 03/11/2015 1626   CO2 32 05/17/2014 0512   GLUCOSE 122 (H) 03/11/2015 1626   GLUCOSE 118 (H) 05/17/2014 0512   BUN 30 (H) 03/11/2015 1626   BUN 7 05/17/2014 0512   CREATININE 1.62 (H) 03/11/2015 1626   CREATININE 0.94 05/17/2014 0512   CALCIUM 8.3 (L) 03/11/2015 1626   CALCIUM 7.9 (L) 05/17/2014 0512   GFRNONAA 38 (L) 03/11/2015 1626   GFRNONAA >60 05/17/2014 0512   GFRNONAA >60 04/25/2012 0557   GFRAA 44 (L) 03/11/2015 1626   GFRAA >60 05/17/2014 0512   GFRAA >60 04/25/2012 0557   CrCl cannot be calculated (Patient's most recent lab result is older than the maximum 21 days allowed.).  COAG Lab Results  Component Value Date   INR 0.9 04/25/2012    Radiology No results found.   Assessment/Plan Essential hypertension, benign blood pressure control important in reducing the progression of atherosclerotic disease. On appropriate oral medications.   Carotid stenosis His carotid duplex shows a patent right carotid endarterectomy.  His left carotid  velocities are slightly lower than they were last year and are now consistent with a 1 to 39% stenosis. Will continue to follow this on an annual basis. Continue aspirin and statin agent.     Festus Barren, MD  11/27/2017 11:36 AM    This note was created with Dragon medical transcription system.  Any errors from dictation are purely unintentional

## 2018-12-03 ENCOUNTER — Ambulatory Visit (INDEPENDENT_AMBULATORY_CARE_PROVIDER_SITE_OTHER): Payer: Medicare Other | Admitting: Vascular Surgery

## 2018-12-03 ENCOUNTER — Encounter (INDEPENDENT_AMBULATORY_CARE_PROVIDER_SITE_OTHER): Payer: Medicare Other

## 2019-03-11 ENCOUNTER — Ambulatory Visit (INDEPENDENT_AMBULATORY_CARE_PROVIDER_SITE_OTHER): Payer: Medicare Other | Admitting: Vascular Surgery

## 2019-03-11 ENCOUNTER — Encounter (INDEPENDENT_AMBULATORY_CARE_PROVIDER_SITE_OTHER): Payer: Self-pay | Admitting: Vascular Surgery

## 2019-03-11 ENCOUNTER — Ambulatory Visit (INDEPENDENT_AMBULATORY_CARE_PROVIDER_SITE_OTHER): Payer: Medicare Other

## 2019-03-11 ENCOUNTER — Other Ambulatory Visit (INDEPENDENT_AMBULATORY_CARE_PROVIDER_SITE_OTHER): Payer: Self-pay | Admitting: Vascular Surgery

## 2019-03-11 ENCOUNTER — Encounter (INDEPENDENT_AMBULATORY_CARE_PROVIDER_SITE_OTHER): Payer: Self-pay

## 2019-03-11 ENCOUNTER — Other Ambulatory Visit: Payer: Self-pay

## 2019-03-11 VITALS — BP 142/85 | HR 71 | Resp 16 | Wt 173.6 lb

## 2019-03-11 DIAGNOSIS — I6523 Occlusion and stenosis of bilateral carotid arteries: Secondary | ICD-10-CM

## 2019-03-11 DIAGNOSIS — I10 Essential (primary) hypertension: Secondary | ICD-10-CM

## 2019-03-11 NOTE — Assessment & Plan Note (Signed)
Carotid duplex today shows a widely patent right carotid endarterectomy and stable stenosis in the 1 to 39% range on the left.  He asked today about stretching out his follow-up so we will now moved 18 months but he will contact us with any problems in the interim.  No change in medical regimen.

## 2019-03-11 NOTE — Progress Notes (Signed)
MRN : [id number]  Aaron Wilkinson is a 85 y.o. (05/20/1930) male who presents with chief complaint of  Chief Complaint  Patient presents with  . Follow-up    ultrasound follow up  .  History of Present Illness: Patient returns in follow-up of his carotid disease.  He is doing well without complaints today.  No focal neurologic symptoms. Specifically, the patient denies amaurosis fugax, speech or swallowing difficulties, or arm or leg weakness or numbness. Carotid duplex today shows a widely patent right carotid endarterectomy and stable stenosis in the 1 to 39% range on the left.  Current Outpatient Medications  Medication Sig Dispense Refill  . ALPRAZolam (XANAX) 0.25 MG tablet Take 0.125-0.25 mg by mouth 2 (two) times daily as needed for anxiety.     Marland Kitchen aspirin EC 81 MG tablet Take by mouth.     . cyclobenzaprine (FLEXERIL) 5 MG tablet TAKE 1 TABLET BY MOUTH 3 TIMES DAILY AS NEEDED MUSCLE SPASMS    . amLODipine (NORVASC) 5 MG tablet Take by mouth.    Marland Kitchen atorvastatin (LIPITOR) 10 MG tablet Take 10 mg by mouth at bedtime.    . gabapentin (NEURONTIN) 100 MG capsule Take by mouth.    . ondansetron (ZOFRAN) 4 MG tablet Take 1-2 tabs by mouth every 8 hours as needed for nausea/vomiting (Patient not taking: Reported on 11/27/2017) 30 tablet 0   No current facility-administered medications for this visit.     Past Medical History:  Diagnosis Date  . Coronary artery disease    s/p CABG  . Hyperlipidemia   . Hypertension   . Obesity     Past Surgical History:  Procedure Laterality Date  . cardiac bipass    . CAROTID ARTERY ANGIOPLASTY    . CHOLECYSTECTOMY      Social History Social History   Tobacco Use  . Smoking status: Former Research scientist (life sciences)  . Smokeless tobacco: Never Used  Substance Use Topics  . Alcohol use: Yes  . Drug use: No    Family History No bleeding or clotting disorder  Allergies  Allergen Reactions  . Penicillins Anaphylaxis  . Atorvastatin Diarrhea  .  Fenofibrate     Other reaction(s): Muscle Pain  . Lovastatin     Other reaction(s): Muscle Pain  . Pravastatin     Other reaction(s): Muscle Pain  . Statins Other (See Comments)    Reaction:  Muscle pain      REVIEW OF SYSTEMS (Negative unless checked)  Constitutional: [] Weight loss  [] Fever  [] Chills Cardiac: [] Chest pain   [] Chest pressure   [] Palpitations   [] Shortness of breath when laying flat   [] Shortness of breath at rest   [] Shortness of breath with exertion. Vascular:  [] Pain in legs with walking   [] Pain in legs at rest   [] Pain in legs when laying flat   [] Claudication   [] Pain in feet when walking  [] Pain in feet at rest  [] Pain in feet when laying flat   [] History of DVT   [] Phlebitis   [] Swelling in legs   [] Varicose veins   [] Non-healing ulcers Pulmonary:   [] Uses home oxygen   [] Productive cough   [] Hemoptysis   [] Wheeze  [] COPD   [] Asthma Neurologic:  [] Dizziness  [] Blackouts   [] Seizures   [] History of stroke   [] History of TIA  [] Aphasia   [] Temporary blindness   [] Dysphagia   [] Weakness or numbness in arms   [] Weakness or numbness in legs Musculoskeletal:  [x] Arthritis   [] Joint swelling   [  x]Joint pain   [] Low back pain Hematologic:  [] Easy bruising  [] Easy bleeding   [] Hypercoagulable state   [] Anemic  [] Hepatitis Gastrointestinal:  [] Blood in stool   [] Vomiting blood  [] Gastroesophageal reflux/heartburn   [] Difficulty swallowing. Genitourinary:  [] Chronic kidney disease   [] Difficult urination  [] Frequent urination  [] Burning with urination   [] Blood in urine Skin:  [] Rashes   [] Ulcers   [] Wounds Psychological:  [] History of anxiety   []  History of major depression.  Physical Examination  Vitals:   03/11/19 1132 03/11/19 1133  BP: (!) 153/91 (!) 142/85  Pulse: 71   Resp: 16   Weight: 173 lb 9.6 oz (78.7 kg)    Body mass index is 26.79 kg/m. Gen:  WD/WN, NAD. Appears younger than stated age. Head: [place]/AT, No temporalis wasting. Ear/Nose/Throat: Hearing  grossly intact, nares w/o erythema or drainage, trachea midline Eyes: Conjunctiva clear. Sclera non-icteric Neck: Supple.  No bruit  Pulmonary:  Good air movement, equal and clear to auscultation bilaterally.  Cardiac: RRR, No JVD Vascular:  Vessel Right Left  Radial Palpable Palpable                Musculoskeletal: M/S 5/5 throughout.  No deformity or atrophy. No LE edema. Neurologic: CN 2-12 intact. Sensation grossly intact in extremities.  Symmetrical.  Speech is fluent. Motor exam as listed above. Psychiatric: Judgment intact, Mood & affect appropriate for pt's clinical situation. Dermatologic: No rashes or ulcers noted.  No cellulitis or open wounds. Lymph : No Cervical, Axillary, or Inguinal lymphadenopathy.     CBC Lab Results  Component Value Date   WBC 5.3 03/11/2015   HGB 17.1 03/11/2015   HCT 49.3 03/11/2015   MCV 92.8 03/11/2015   PLT 181 03/11/2015    BMET    Component Value Date/Time   NA 136 03/11/2015 1626   NA 141 05/17/2014 0512   K 4.7 03/11/2015 1626   K 4.1 05/17/2014 0512   CL 100 (L) 03/11/2015 1626   CL 102 05/17/2014 0512   CO2 24 03/11/2015 1626   CO2 32 05/17/2014 0512   GLUCOSE 122 (H) 03/11/2015 1626   GLUCOSE 118 (H) 05/17/2014 0512   BUN 30 (H) 03/11/2015 1626   BUN 7 05/17/2014 0512   CREATININE 1.62 (H) 03/11/2015 1626   CREATININE 0.94 05/17/2014 0512   CALCIUM 8.3 (L) 03/11/2015 1626   CALCIUM 7.9 (L) 05/17/2014 0512   GFRNONAA 38 (L) 03/11/2015 1626   GFRNONAA >60 05/17/2014 0512   GFRNONAA >60 04/25/2012 0557   GFRAA 44 (L) 03/11/2015 1626   GFRAA >60 05/17/2014 0512   GFRAA >60 04/25/2012 0557   CrCl cannot be calculated (Patient's most recent lab result is older than the maximum 21 days allowed.).  COAG Lab Results  Component Value Date   INR 0.9 04/25/2012    Radiology [facility] Carotid  Result Date: 03/11/2019 Carotid Arterial Duplex Study Indications:       Carotid artery disease. Other Factors:     Right CEA  on 04/24/12. Comparison Study:  11/27/2017 Performing Technologist: Debbe BalesSolomon Mcclary RVS  Examination Guidelines: A complete evaluation includes B-mode imaging, spectral Doppler, color Doppler, and power Doppler as needed of all accessible portions of each vessel. Bilateral testing is considered an integral part of a complete examination. Limited examinations for reoccurring indications may be performed as noted.  Right Carotid Findings: +----------+--------+--------+--------+--------+--------+           PSV cm/sEDV cm/sStenosisDescribeComments +----------+--------+--------+--------+--------+--------+ CCA Prox  80  14                               +----------+--------+--------+--------+--------+--------+ CCA Mid   71      16                               +----------+--------+--------+--------+--------+--------+ CCA Distal69      14                               +----------+--------+--------+--------+--------+--------+ ICA Prox  62      15                               +----------+--------+--------+--------+--------+--------+ ICA Mid   61      18                               +----------+--------+--------+--------+--------+--------+ ICA Distal69      19                               +----------+--------+--------+--------+--------+--------+ ECA       62      11                               +----------+--------+--------+--------+--------+--------+ +----------+--------+-------+--------+-------------------+           PSV cm/sEDV cmsDescribeArm Pressure (mmHG) +----------+--------+-------+--------+-------------------+ [id number]      0                                  +----------+--------+-------+--------+-------------------+ +---------+--------+--+--------+-+ VertebralPSV cm/s47EDV cm/s9 +---------+--------+--+--------+-+  Left Carotid Findings: +----------+-------+--------+--------+--------------------------------+--------+           PSV     EDV cm/sStenosisDescribe                        Comments           cm/s                                                            +----------+-------+--------+--------+--------------------------------+--------+ CCA Prox  78     20                                                       +----------+-------+--------+--------+--------------------------------+--------+ CCA Mid   78     19                                                       +----------+-------+--------+--------+--------------------------------+--------+ CCA Distal66     16                                                       +----------+-------+--------+--------+--------------------------------+--------+  ICA Prox  93     18              homogeneous, irregular and                                                calcific                                 +----------+-------+--------+--------+--------------------------------+--------+ ICA Mid   76     18                                                       +----------+-------+--------+--------+--------------------------------+--------+ ICA Distal77     23                                                       +----------+-------+--------+--------+--------------------------------+--------+ ECA       76     8                                                        +----------+-------+--------+--------+--------------------------------+--------+ +----------+--------+--------+--------+-------------------+ SubclavianPSV cm/sEDV cm/sDescribeArm Pressure (mmHG) +----------+--------+--------+--------+-------------------+           44      0                                   +----------+--------+--------+--------+-------------------+ +---------+--------+--+--------+--+ VertebralPSV cm/s83EDV cm/s13 +---------+--------+--+--------+--+  Summary: Right Carotid: Velocities in the right ICA are consistent with a 1-39% stenosis. Left  Carotid: Velocities in the left ICA are consistent with a 1-39% stenosis. Vertebrals:  Bilateral vertebral arteries demonstrate antegrade flow. Subclavians: Normal flow hemodynamics were seen in bilateral subclavian              arteries. *See table(s) above for measurements and observations.  Electronically signed by Festus BarrenJason Jett Fukuda MD on 03/11/2019 at 12:05:16 PM.    Final      Assessment/Plan Essential hypertension, benign blood pressure control important in reducing the progression of atherosclerotic disease. On appropriate oral medications.   Carotid stenosis Carotid duplex today shows a widely patent right carotid endarterectomy and stable stenosis in the 1 to 39% range on the left.  He asked today about stretching out his follow-up so we will now moved 18 months but he will contact us with any problems in the interim.  No change in medical regimen.    Festus BarrenJason Kermitt Harjo, MD  03/11/2019 12:10 PM    This note was created with Dragon medical transcription system.  Any errors from dictation are purely unintentional

## 2019-03-11 NOTE — Assessment & Plan Note (Signed)
blood pressure control important in reducing the progression of atherosclerotic disease. On appropriate oral medications.  

## 2019-09-20 ENCOUNTER — Ambulatory Visit: Payer: Medicare Other | Attending: Internal Medicine

## 2019-09-20 DIAGNOSIS — Z23 Encounter for immunization: Secondary | ICD-10-CM | POA: Insufficient documentation

## 2019-09-20 NOTE — Progress Notes (Signed)
   Covid-19 Vaccination Clinic  Name:  Aaron Wilkinson    MRN: [id number] DOB: 1930/05/31  09/20/2019  Aaron Wilkinson was observed post Covid-19 immunization for 15 minutes without incidence. He was provided with Vaccine Information Sheet and instruction to access the V-Safe system.   Aaron Wilkinson was instructed to call 911 with any severe reactions post vaccine: Marland Kitchen Difficulty breathing  . Swelling of your face and throat  . A fast heartbeat  . A bad rash all over your body  . Dizziness and weakness    Immunizations Administered    Name Date Dose VIS Date Route   Pfizer COVID-19 Vaccine 09/20/2019  8:52 AM 0.3 mL 07/11/2019 Intramuscular   Manufacturer: [employer], [employer]   Lot: [id number]   NDC: [address]-7

## 2019-10-14 ENCOUNTER — Ambulatory Visit: Payer: Medicare Other | Attending: Internal Medicine

## 2019-10-14 DIAGNOSIS — Z23 Encounter for immunization: Secondary | ICD-10-CM

## 2019-10-14 NOTE — Progress Notes (Signed)
   Covid-19 Vaccination Clinic  Name:  CEM KOSMAN    MRN: [id number] DOB: Oct 19, 1929  10/14/2019  Mr. Strohecker was observed post Covid-19 immunization for 15 minutes without incident. He was provided with Vaccine Information Sheet and instruction to access the V-Safe system.   Mr. Dirocco was instructed to call 911 with any severe reactions post vaccine: Marland Kitchen Difficulty breathing  . Swelling of face and throat  . A fast heartbeat  . A bad rash all over body  . Dizziness and weakness   Immunizations Administered    Name Date Dose VIS Date Route   Pfizer COVID-19 Vaccine 10/14/2019  9:19 AM 0.3 mL 07/11/2019 Intramuscular   Manufacturer: [employer], [employer]   Lot: [id number]   NDC: [address]-0

## 2020-07-26 ENCOUNTER — Other Ambulatory Visit: Payer: Medicare Other

## 2020-07-26 ENCOUNTER — Emergency Department
Admission: EM | Admit: 2020-07-26 | Discharge: 2020-07-26 | Discharge status: Against Medical Advice | Payer: Medicare Other

## 2020-07-26 DIAGNOSIS — Z20822 Contact with and (suspected) exposure to covid-19: Secondary | ICD-10-CM

## 2020-07-26 NOTE — ED Notes (Signed)
Pt called in the WR with no response 

## 2020-07-28 LAB — SARS-COV-2, NAA 2 DAY TAT

## 2020-07-28 LAB — NOVEL CORONAVIRUS, NAA: SARS-CoV-2, NAA: NOT DETECTED

## 2020-08-10 ENCOUNTER — Ambulatory Visit (INDEPENDENT_AMBULATORY_CARE_PROVIDER_SITE_OTHER): Payer: Medicare Other | Admitting: Vascular Surgery

## 2020-08-10 ENCOUNTER — Ambulatory Visit (INDEPENDENT_AMBULATORY_CARE_PROVIDER_SITE_OTHER): Payer: Medicare Other

## 2020-08-10 ENCOUNTER — Other Ambulatory Visit: Payer: Self-pay

## 2020-08-10 ENCOUNTER — Encounter (INDEPENDENT_AMBULATORY_CARE_PROVIDER_SITE_OTHER): Payer: Self-pay | Admitting: Vascular Surgery

## 2020-08-10 VITALS — BP 136/85 | HR 89 | Resp 16 | Wt 161.0 lb

## 2020-08-10 DIAGNOSIS — I10 Essential (primary) hypertension: Secondary | ICD-10-CM | POA: Diagnosis not present

## 2020-08-10 DIAGNOSIS — I6523 Occlusion and stenosis of bilateral carotid arteries: Secondary | ICD-10-CM

## 2020-08-10 NOTE — Assessment & Plan Note (Signed)
blood pressure control important in reducing the progression of atherosclerotic disease. On appropriate oral medications.  

## 2020-08-10 NOTE — Progress Notes (Signed)
MRN : [id number]  Aaron Wilkinson is a 87 y.o. ([birth year]) male who presents with chief complaint of  Chief Complaint  Patient presents with  . Carotid    1 1/67yr ultrasound follow up  .  History of Present Illness: Patient returns in follow-up of his carotid disease.  He had a right carotid endarterectomy performed in 2013.  He is doing well.  He has had surgery since his last visit but no other issues.  He denies any focal neurologic symptoms. Specifically, the patient denies amaurosis fugax, speech or swallowing difficulties, or arm or leg weakness or numbness.  Remains on aspirin and Lipitor. Carotid duplex today reveals a widely patent right carotid endarterectomy and 1 to 39% left ICA stenosis.  No change from previous studies.  Current Outpatient Medications  Medication Sig Dispense Refill  . ALPRAZolam (XANAX) 0.25 MG tablet Take 0.125-0.25 mg by mouth 2 (two) times daily as needed for anxiety.     Marland Kitchen amLODipine (NORVASC) 10 MG tablet Take by mouth.    . cyclobenzaprine (FLEXERIL) 5 MG tablet TAKE 1 TABLET BY MOUTH 3 TIMES DAILY AS NEEDED MUSCLE SPASMS    . vitamin B-12 (CYANOCOBALAMIN) 1000 MCG tablet Take 1,000 mcg by mouth daily.    Marland Kitchen aspirin EC 81 MG tablet Take by mouth.  (Patient not taking: Reported on 08/10/2020)    . atorvastatin (LIPITOR) 10 MG tablet Take 10 mg by mouth at bedtime. (Patient not taking: Reported on 08/10/2020)    . gabapentin (NEURONTIN) 100 MG capsule Take by mouth.    . ondansetron (ZOFRAN) 4 MG tablet Take 1-2 tabs by mouth every 8 hours as needed for nausea/vomiting (Patient not taking: No sig reported) 30 tablet 0   No current facility-administered medications for this visit.    Past Medical History:  Diagnosis Date  . Coronary artery disease    s/p CABG  . Hyperlipidemia   . Hypertension   . Obesity     Past Surgical History:  Procedure Laterality Date  . cardiac bipass    . CAROTID ARTERY ANGIOPLASTY    . CHOLECYSTECTOMY        Social History   Tobacco Use  . Smoking status: Former Games developer  . Smokeless tobacco: Never Used  Substance Use Topics  . Alcohol use: Yes  . Drug use: No   Family History No bleeding or clotting disorders  Allergies  Allergen Reactions  . Penicillins Anaphylaxis  . Atorvastatin Diarrhea  . Fenofibrate     Other reaction(s): Muscle Pain  . Lovastatin     Other reaction(s): Muscle Pain  . Pravastatin     Other reaction(s): Muscle Pain  . Statins Other (See Comments)    Reaction:  Muscle pain      REVIEW OF SYSTEMS (Negative unless checked)  Constitutional: [] Weight loss  [] Fever  [] Chills Cardiac: [] Chest pain   [] Chest pressure   [] Palpitations   [] Shortness of breath when laying flat   [] Shortness of breath at rest   [] Shortness of breath with exertion. Vascular:  [] Pain in legs with walking   [] Pain in legs at rest   [] Pain in legs when laying flat   [] Claudication   [] Pain in feet when walking  [] Pain in feet at rest  [] Pain in feet when laying flat   [] History of DVT   [] Phlebitis   [] Swelling in legs   [] Varicose veins   [] Non-healing ulcers Pulmonary:   [] Uses home oxygen   [] Productive cough   [] Hemoptysis   []   Wheeze  [] COPD   [] Asthma Neurologic:  [] Dizziness  [] Blackouts   [] Seizures   [] History of stroke   [] History of TIA  [] Aphasia   [] Temporary blindness   [] Dysphagia   [] Weakness or numbness in arms   [] Weakness or numbness in legs Musculoskeletal:  [x] Arthritis   [] Joint swelling   [x] Joint pain   [] Low back pain Hematologic:  [] Easy bruising  [] Easy bleeding   [] Hypercoagulable state   [] Anemic  [] Hepatitis Gastrointestinal:  [] Blood in stool   [] Vomiting blood  [] Gastroesophageal reflux/heartburn   [] Difficulty swallowing. Genitourinary:  [] Chronic kidney disease   [] Difficult urination  [] Frequent urination  [] Burning with urination   [] Blood in urine Skin:  [] Rashes   [] Ulcers   [] Wounds Psychological:  [] History of anxiety   []  History of major  depression.  Physical Examination  Vitals:   08/10/20 1111  BP: 136/85  Pulse: 89  Resp: 16  Weight: 161 lb (73 kg)   Body mass index is 24.84 kg/m. Gen:  WD/WN, NAD.  Appears much younger than stated age Head: [place]/AT, No temporalis wasting. Ear/Nose/Throat: Hearing grossly intact, nares w/o erythema or drainage, trachea midline Eyes: Conjunctiva clear. Sclera non-icteric Neck: Supple.  Right carotid endarterectomy site is well-healed.  No bruit  Pulmonary:  Good air movement, equal and clear to auscultation bilaterally.  Cardiac: RRR, No JVD Vascular:  Vessel Right Left  Radial Palpable Palpable           Musculoskeletal: M/S 5/5 throughout.  No deformity or atrophy.  No edema. Neurologic: CN 2-12 intact. Sensation grossly intact in extremities.  Symmetrical.  Speech is fluent. Motor exam as listed above. Psychiatric: Judgment intact, Mood & affect appropriate for pt's clinical situation. Dermatologic: No rashes or ulcers noted.  No cellulitis or open wounds.      CBC Lab Results  Component Value Date   WBC 5.3 03/11/2015   HGB 17.1 03/11/2015   HCT 49.3 03/11/2015   MCV 92.8 03/11/2015   PLT 181 03/11/2015    BMET    Component Value Date/Time   NA 136 03/11/2015 1626   NA 141 05/17/2014 0512   K 4.7 03/11/2015 1626   K 4.1 05/17/2014 0512   CL 100 (L) 03/11/2015 1626   CL 102 05/17/2014 0512   CO2 24 03/11/2015 1626   CO2 32 05/17/2014 0512   GLUCOSE 122 (H) 03/11/2015 1626   GLUCOSE 118 (H) 05/17/2014 0512   BUN 30 (H) 03/11/2015 1626   BUN 7 05/17/2014 0512   CREATININE 1.62 (H) 03/11/2015 1626   CREATININE 0.94 05/17/2014 0512   CALCIUM 8.3 (L) 03/11/2015 1626   CALCIUM 7.9 (L) 05/17/2014 0512   GFRNONAA 38 (L) 03/11/2015 1626   GFRNONAA >60 05/17/2014 0512   GFRNONAA >60 04/25/2012 0557   GFRAA 44 (L) 03/11/2015 1626   GFRAA >60 05/17/2014 0512   GFRAA >60 04/25/2012 0557   CrCl cannot be calculated (Patient's most recent lab result is older  than the maximum 21 days allowed.).  COAG Lab Results  Component Value Date   INR 0.9 04/25/2012    Radiology No results found.   Assessment/Plan Essential hypertension, benign blood pressure control important in reducing the progression of atherosclerotic disease. On appropriate oral medications.   Carotid stenosis Carotid duplex today reveals a widely patent right carotid endarterectomy and 1 to 39% left ICA stenosis.  No change from previous studies.  Recheck in a year and a half.  No change in medical regimen.    ,  MD  08/10/2020 11:49 AM    This note was created with Dragon medical transcription system.  Any errors from dictation are purely unintentional

## 2020-08-10 NOTE — Assessment & Plan Note (Signed)
Carotid duplex today reveals a widely patent right carotid endarterectomy and 1 to 39% left ICA stenosis.  No change from previous studies.  Recheck in a year and a half.  No change in medical regimen.

## 2022-01-03 ENCOUNTER — Encounter (INDEPENDENT_AMBULATORY_CARE_PROVIDER_SITE_OTHER): Payer: Medicare Other

## 2022-01-03 ENCOUNTER — Ambulatory Visit (INDEPENDENT_AMBULATORY_CARE_PROVIDER_SITE_OTHER): Payer: Medicare Other | Admitting: Vascular Surgery

## 2022-05-29 ENCOUNTER — Encounter (INDEPENDENT_AMBULATORY_CARE_PROVIDER_SITE_OTHER): Payer: Self-pay
